# Patient Record
Sex: Female | Born: 1990 | Race: White | Hispanic: No | Marital: Married | Smoking: Current every day smoker
Health system: Southern US, Community
[De-identification: ages and names within clinical notes are randomized; demographics above are authoritative.]

## PROBLEM LIST (undated history)

## (undated) ENCOUNTER — Inpatient Hospital Stay (HOSPITAL_COMMUNITY): Payer: Self-pay

## (undated) DIAGNOSIS — J45909 Unspecified asthma, uncomplicated: Secondary | ICD-10-CM

## (undated) DIAGNOSIS — K219 Gastro-esophageal reflux disease without esophagitis: Secondary | ICD-10-CM

## (undated) DIAGNOSIS — F319 Bipolar disorder, unspecified: Secondary | ICD-10-CM

## (undated) DIAGNOSIS — K259 Gastric ulcer, unspecified as acute or chronic, without hemorrhage or perforation: Secondary | ICD-10-CM

## (undated) DIAGNOSIS — K5900 Constipation, unspecified: Secondary | ICD-10-CM

## (undated) HISTORY — DX: Bipolar disorder, unspecified: F31.9

## (undated) HISTORY — DX: Gastro-esophageal reflux disease without esophagitis: K21.9

## (undated) HISTORY — PX: DILATION AND CURETTAGE OF UTERUS: SHX78

## (undated) HISTORY — DX: Constipation, unspecified: K59.00

## (undated) HISTORY — DX: Gastric ulcer, unspecified as acute or chronic, without hemorrhage or perforation: K25.9

---
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
## 2011-07-08 LAB — RAPID URINE DRUG SCREEN, HOSP PERFORMED
Amphetamines: NOT DETECTED
Benzodiazepines: POSITIVE — AB
Cocaine: NOT DETECTED
Opiates: NOT DETECTED
Tetrahydrocannabinol: POSITIVE — AB

## 2011-07-08 LAB — DIFFERENTIAL
Basophils Absolute: 0
Basophils Relative: 0
Eosinophils Absolute: 0.4
Eosinophils Relative: 5
Lymphocytes Relative: 43
Lymphs Abs: 3.7
Monocytes Absolute: 0.5
Monocytes Relative: 6
Neutro Abs: 4
Neutrophils Relative %: 46

## 2011-07-08 LAB — CBC
HCT: 36
Hemoglobin: 12.7
MCHC: 35.3
MCV: 89.9
Platelets: 190
RBC: 4.01
RDW: 12.8
WBC: 8.6

## 2011-07-08 LAB — BASIC METABOLIC PANEL
BUN: 15
CO2: 22
Calcium: 8.7
Chloride: 108
Creatinine, Ser: 0.63
Glucose, Bld: 84
Potassium: 3.3 — ABNORMAL LOW
Sodium: 139

## 2011-07-08 LAB — ETHANOL

## 2011-07-08 LAB — POCT PREGNANCY, URINE: Operator id: 22250

## 2011-07-24 LAB — CBC
HCT: 31.4 — ABNORMAL LOW
HCT: 37.5
Hemoglobin: 11.1 — ABNORMAL LOW
Hemoglobin: 13.1
MCHC: 34.9
MCHC: 35.5
MCV: 93.7
MCV: 94.3
Platelets: 154 — ABNORMAL LOW
Platelets: 209
RBC: 3.35 — ABNORMAL LOW
RBC: 3.98
RDW: 12.5
RDW: 12.7
WBC: 10
WBC: 9

## 2011-07-24 LAB — RPR: RPR Ser Ql: NONREACTIVE

## 2011-07-25 LAB — URINALYSIS, ROUTINE W REFLEX MICROSCOPIC
Bilirubin Urine: NEGATIVE
Bilirubin Urine: NEGATIVE
Glucose, UA: NEGATIVE
Glucose, UA: NEGATIVE
Hgb urine dipstick: NEGATIVE
Ketones, ur: NEGATIVE
Ketones, ur: NEGATIVE
Nitrite: NEGATIVE
Nitrite: NEGATIVE
Protein, ur: NEGATIVE
Protein, ur: NEGATIVE
Specific Gravity, Urine: 1.015
Specific Gravity, Urine: 1.025
Urobilinogen, UA: 0.2
Urobilinogen, UA: 0.2
pH: 6.5
pH: 7.5

## 2011-07-25 LAB — URINE CULTURE
Colony Count: 15000
Colony Count: 40000

## 2011-07-25 LAB — CBC
HCT: 32.8 — ABNORMAL LOW
Hemoglobin: 11.5 — ABNORMAL LOW
MCHC: 35.2
MCV: 93.7
Platelets: 191
RBC: 3.5 — ABNORMAL LOW
RDW: 13
WBC: 10.6 — ABNORMAL HIGH

## 2011-07-25 LAB — URINE MICROSCOPIC-ADD ON

## 2011-07-25 LAB — KLEIHAUER-BETKE STAIN
Fetal Cells %: 0
Quantitation Fetal Hemoglobin: 0

## 2011-07-28 LAB — URINALYSIS, ROUTINE W REFLEX MICROSCOPIC
Bilirubin Urine: NEGATIVE
Glucose, UA: NEGATIVE
Hgb urine dipstick: NEGATIVE
Ketones, ur: NEGATIVE
Nitrite: NEGATIVE
Protein, ur: NEGATIVE
Specific Gravity, Urine: 1.009
Urobilinogen, UA: 0.2
pH: 6.5

## 2011-07-28 LAB — I-STAT 8, (EC8 V) (CONVERTED LAB)
Acid-base deficit: 3 — ABNORMAL HIGH
BUN: 4 — ABNORMAL LOW
Bicarbonate: 20.7
Chloride: 106
Glucose, Bld: 84
HCT: 35 — ABNORMAL LOW
Hemoglobin: 11.9 — ABNORMAL LOW
Operator id: 279831
Potassium: 3.6
Sodium: 138
TCO2: 22
pCO2, Ven: 32.5 — ABNORMAL LOW
pH, Ven: 7.412 — ABNORMAL HIGH

## 2011-07-28 LAB — POCT I-STAT CREATININE
Creatinine, Ser: 0.5
Operator id: 279831

## 2011-10-14 HISTORY — PX: ESOPHAGOGASTRODUODENOSCOPY: SHX1529

## 2015-06-24 ENCOUNTER — Encounter (HOSPITAL_COMMUNITY): Payer: Self-pay | Admitting: *Deleted

## 2015-06-24 ENCOUNTER — Inpatient Hospital Stay (HOSPITAL_COMMUNITY)
Admission: AD | Admit: 2015-06-24 | Discharge: 2015-06-24 | Disposition: A | Discharge status: Home / Self Care | Payer: Medicaid Other | Source: Ambulatory Visit | Attending: Obstetrics & Gynecology | Admitting: Obstetrics & Gynecology

## 2015-06-24 DIAGNOSIS — R0789 Other chest pain: Secondary | ICD-10-CM | POA: Insufficient documentation

## 2015-06-24 DIAGNOSIS — O26893 Other specified pregnancy related conditions, third trimester: Secondary | ICD-10-CM | POA: Diagnosis not present

## 2015-06-24 DIAGNOSIS — Z3A36 36 weeks gestation of pregnancy: Secondary | ICD-10-CM | POA: Insufficient documentation

## 2015-06-24 DIAGNOSIS — M62830 Muscle spasm of back: Secondary | ICD-10-CM

## 2015-06-24 DIAGNOSIS — Z87891 Personal history of nicotine dependence: Secondary | ICD-10-CM | POA: Insufficient documentation

## 2015-06-24 DIAGNOSIS — O9989 Other specified diseases and conditions complicating pregnancy, childbirth and the puerperium: Secondary | ICD-10-CM

## 2015-06-24 HISTORY — DX: Gastric ulcer, unspecified as acute or chronic, without hemorrhage or perforation: K25.9

## 2015-06-24 LAB — URINE MICROSCOPIC-ADD ON

## 2015-06-24 LAB — URINALYSIS, ROUTINE W REFLEX MICROSCOPIC
Bilirubin Urine: NEGATIVE
Glucose, UA: NEGATIVE mg/dL
Ketones, ur: NEGATIVE mg/dL
Nitrite: NEGATIVE
Protein, ur: NEGATIVE mg/dL
Specific Gravity, Urine: 1.015 (ref 1.005–1.030)
Urobilinogen, UA: 1 mg/dL (ref 0.0–1.0)
pH: 7 (ref 5.0–8.0)

## 2015-06-24 MED ORDER — CYCLOBENZAPRINE HCL 5 MG PO TABS: 5.0000 mg | ORAL_TABLET | Freq: Three times a day (TID) | ORAL | Status: DC | PRN

## 2015-06-24 MED ORDER — OXYCODONE-ACETAMINOPHEN 5-325 MG PO TABS
1.0000 | ORAL_TABLET | Freq: Once | ORAL | Status: AC
  Administered 2015-06-24: 1 via ORAL
  Filled 2015-06-24: qty 1

## 2015-06-24 NOTE — Discharge Instructions (Signed)

## 2015-06-24 NOTE — MAU Provider Note (Signed)
History     CSN: [id number] Arrival date and time: 06/24/15 7829  First Provider Initiated Contact with Patient 06/24/15 0208      Chief Complaint  Patient presents with  . Chest Pain   HPI Patient is 24 y.o. [id number] [redacted]w[redacted]d here with complaints of right chest wall pain Started at 945/10PM while lying down. Tried massaging. Feels a ball under here skin.  Never ahd before.  Reports she has been packing. Pain worse with strain- ie with sneezing, changing positions. Pain is non-radiating.  Did not try any medications for pain.   Denies dysuria, polyuria. Denies history of kidney infection. Denies SOB, trouble breathing.  No swelling or pain in calf.   +FM, denies LOF, VB, contractions, vaginal discharge.  Takes Zoloft and PNV Prenatal care- [facility] in [place], plans to [facility] at [facility].   OB History    Gravida Para Term Preterm AB TAB SAB Ectopic Multiple Living   Past Medical History  Diagnosis Date  . Stomach ulcer     Past Surgical History  Procedure Laterality Date  . Dilation and curettage of uterus      No family history on file.  Social History  Substance Use Topics  . Smoking status: Former Smoker    Quit date: 02/11/2015  . Smokeless tobacco: None  . Alcohol Use: No    Allergies: Allergies not on file  No prescriptions prior to admission    Review of Systems  Constitutional: Negative for fever and chills.  Eyes: Negative for blurred vision and double vision.  Respiratory: Negative for cough and shortness of breath.   Cardiovascular: Negative for chest pain and orthopnea.  Gastrointestinal: Negative for nausea and vomiting.  Genitourinary: Negative for dysuria, frequency and flank pain.  Musculoskeletal: Negative for myalgias and back pain.  Skin: Negative for rash.  Neurological: Negative for dizziness, tingling, weakness and headaches.  Endo/Heme/Allergies: Does not bruise/bleed easily.  Psychiatric/Behavioral:  Negative for depression and suicidal ideas. The patient is not nervous/anxious.    Physical Exam   Blood pressure 106/64, pulse 126, temperature 98.2 F (36.8 C), resp. rate 20, height  (1.626 m), weight 179 lb 6.4 oz (81.375 kg), SpO2 100 %.  Physical Exam  Nursing note and vitals reviewed. Constitutional: She is oriented to person, place, and time. She appears well-developed and well-nourished. No distress.  Pregnant female  HENT:  Head: Normocephalic and atraumatic.  Eyes: Conjunctivae are normal. No scleral icterus.  Neck: Normal range of motion. Neck supple.  Cardiovascular: Normal rate and intact distal pulses.   TTP over right inferior ribs  Respiratory: Effort normal. She exhibits no tenderness.  GI: Soft. There is no tenderness. There is no rebound and no guarding.  Gravid  Genitourinary: Vagina normal.  Musculoskeletal: Normal range of motion. She exhibits no edema.  Neurological: She is alert and oriented to person, place, and time.  Skin: Skin is warm and dry. No rash noted.  Psychiatric: She has a normal mood and affect.    MAU Course  Procedures  MDM UA- negative NST 125/mod/+accels/no decels Toco quiet  Assessment and Plan  Dessie Tatem is a 24 y.o. [id number] at [redacted]w[redacted]d presenting with Right chest wall pain in the setting of physical strain.   #Right side pain: most likely MSK pain. ddx include urinary course like pyelonephritis but UA is clean. Unlikely related to clot given non-pleuritic nature. No PTL as the  pt has not ctx on toco.  - Will give percocet #1 here in MAU - Home with scheduled tylenol and flexeril prn.  - Recommended stretches and heat to area.    Isa Rankin [facility] 06/24/2015, 2:08 AM

## 2015-06-24 NOTE — MAU Note (Addendum)
Pain in R rib area for couple hours. Worse with movement or when sneezes. Denies LOF or bleeding. WHen I stand up my stomach gets tight and feel pressure where his head is at. Pt gets PNC at [facility] in [place]. Office is in [place]

## 2016-04-28 ENCOUNTER — Encounter (HOSPITAL_COMMUNITY): Payer: Self-pay | Admitting: *Deleted

## 2018-03-15 ENCOUNTER — Emergency Department (HOSPITAL_COMMUNITY)
Admission: EM | Admit: 2018-03-15 | Discharge: 2018-03-15 | Disposition: A | Discharge status: Home / Self Care | Payer: Medicaid Other | Attending: Emergency Medicine | Admitting: Emergency Medicine

## 2018-03-15 ENCOUNTER — Encounter (HOSPITAL_COMMUNITY): Payer: Self-pay

## 2018-03-15 DIAGNOSIS — R569 Unspecified convulsions: Secondary | ICD-10-CM | POA: Insufficient documentation

## 2018-03-15 DIAGNOSIS — R109 Unspecified abdominal pain: Secondary | ICD-10-CM | POA: Diagnosis not present

## 2018-03-15 DIAGNOSIS — Z5321 Procedure and treatment not carried out due to patient leaving prior to being seen by health care provider: Secondary | ICD-10-CM | POA: Insufficient documentation

## 2018-03-15 LAB — URINALYSIS, ROUTINE W REFLEX MICROSCOPIC
Bacteria, UA: NONE SEEN
Bilirubin Urine: NEGATIVE
Glucose, UA: NEGATIVE mg/dL
Ketones, ur: NEGATIVE mg/dL
Leukocytes, UA: NEGATIVE
Nitrite: NEGATIVE
Protein, ur: NEGATIVE mg/dL
Specific Gravity, Urine: 1.024 (ref 1.005–1.030)
pH: 5 (ref 5.0–8.0)

## 2018-03-15 LAB — COMPREHENSIVE METABOLIC PANEL
ALT: 12 U/L — ABNORMAL LOW (ref 14–54)
AST: 17 U/L (ref 15–41)
Albumin: 4.7 g/dL (ref 3.5–5.0)
Alkaline Phosphatase: 50 U/L (ref 38–126)
Anion gap: 10 (ref 5–15)
BUN: 13 mg/dL (ref 6–20)
CO2: 23 mmol/L (ref 22–32)
Calcium: 9.2 mg/dL (ref 8.9–10.3)
Chloride: 108 mmol/L (ref 101–111)
Creatinine, Ser: 0.7 mg/dL (ref 0.44–1.00)
GFR calc Af Amer: >60 mL/min (ref >60)
GFR calc non Af Amer: >60 mL/min (ref >60)
Glucose, Bld: 112 mg/dL — ABNORMAL HIGH (ref 65–99)
Potassium: 4.3 mmol/L (ref 3.5–5.1)
Sodium: 141 mmol/L (ref 135–145)
Total Bilirubin: 0.4 mg/dL (ref 0.3–1.2)
Total Protein: 7.7 g/dL (ref 6.5–8.1)

## 2018-03-15 LAB — CBC
HCT: 48.2 % — ABNORMAL HIGH (ref 36.0–46.0)
Hemoglobin: 15.7 g/dL — ABNORMAL HIGH (ref 12.0–15.0)
MCH: 30.5 pg (ref 26.0–34.0)
MCHC: 32.6 g/dL (ref 30.0–36.0)
MCV: 93.6 fL (ref 78.0–100.0)
Platelets: 254 10*3/uL (ref 150–400)
RBC: 5.15 MIL/uL — ABNORMAL HIGH (ref 3.87–5.11)
RDW: 13.6 % (ref 11.5–15.5)
WBC: 11.9 10*3/uL — ABNORMAL HIGH (ref 4.0–10.5)

## 2018-03-15 LAB — I-STAT BETA HCG BLOOD, ED (MC, WL, AP ONLY): I-stat hCG, quantitative: <5 m[IU]/mL (ref <5)

## 2018-03-15 LAB — LIPASE, BLOOD: Lipase: 27 U/L (ref 11–51)

## 2018-03-15 MED ORDER — OXYCODONE-ACETAMINOPHEN 5-325 MG PO TABS
1.0000 | ORAL_TABLET | ORAL | Status: DC | PRN
  Administered 2018-03-15: 1 via ORAL
  Filled 2018-03-15: qty 1

## 2018-03-15 NOTE — ED Notes (Signed)
Pt stating she was leaving due to wait. Pt was encouraged to stay and be seen. Pt was seen leaving out the lobby.

## 2018-03-15 NOTE — ED Notes (Signed)
Visitor brought patient to desk in wheelchair stating patient just had a 15 second seizure in bathroom. Visitor stated he saw her whole body shaking and brought patient to desk immediately after seizure. No fall, patient stayed in wheelchair. Patient alert and oriented, no change in mental status, patient not post ictal. Patient angry stating "so if my intestines rupture you're just going to leave me out here" and left the desk. Patient reassured that her lab work results will be reviewed and vital signs reassessed as reasonable. Patient and family member expressed dissatisfaction about wait time.

## 2018-03-15 NOTE — ED Notes (Signed)
Pt and significant other came to desk stating that the pt "just had seizure in the bathroom for 15 seconds and something needs to be done now". RN notified

## 2018-03-15 NOTE — ED Triage Notes (Signed)
Pt presents for evaluation of lower abd pain with cramping intermittently x 2 days. Pt reports LMP 2 weeks ago but has had spotting since then. Pt denies pregnancy but states pain feels like labor. No N/V/D.

## 2018-05-27 ENCOUNTER — Other Ambulatory Visit: Payer: Self-pay

## 2018-05-27 ENCOUNTER — Emergency Department (HOSPITAL_COMMUNITY)
Admission: EM | Admit: 2018-05-27 | Discharge: 2018-05-27 | Discharge status: Against Medical Advice | Payer: Medicaid Other | Attending: Emergency Medicine | Admitting: Emergency Medicine

## 2018-05-27 ENCOUNTER — Emergency Department (HOSPITAL_COMMUNITY): Payer: Medicaid Other

## 2018-05-27 ENCOUNTER — Encounter (HOSPITAL_COMMUNITY): Payer: Self-pay

## 2018-05-27 ENCOUNTER — Ambulatory Visit (HOSPITAL_COMMUNITY): Payer: Medicaid Other

## 2018-05-27 DIAGNOSIS — R110 Nausea: Secondary | ICD-10-CM | POA: Diagnosis not present

## 2018-05-27 DIAGNOSIS — R102 Pelvic and perineal pain: Secondary | ICD-10-CM | POA: Diagnosis not present

## 2018-05-27 LAB — CBC
HCT: 40.2 % (ref 36.0–46.0)
Hemoglobin: 13 g/dL (ref 12.0–15.0)
MCH: 31 pg (ref 26.0–34.0)
MCHC: 32.3 g/dL (ref 30.0–36.0)
MCV: 95.9 fL (ref 78.0–100.0)
Platelets: 183 10*3/uL (ref 150–400)
RBC: 4.19 MIL/uL (ref 3.87–5.11)
RDW: 12.7 % (ref 11.5–15.5)
WBC: 5.3 10*3/uL (ref 4.0–10.5)

## 2018-05-27 LAB — COMPREHENSIVE METABOLIC PANEL
ALT: 10 U/L (ref 0–44)
AST: 13 U/L — ABNORMAL LOW (ref 15–41)
Albumin: 4.2 g/dL (ref 3.5–5.0)
Alkaline Phosphatase: 47 U/L (ref 38–126)
Anion gap: 7 (ref 5–15)
BUN: 11 mg/dL (ref 6–20)
CO2: 23 mmol/L (ref 22–32)
Calcium: 8.8 mg/dL — ABNORMAL LOW (ref 8.9–10.3)
Chloride: 110 mmol/L (ref 98–111)
Creatinine, Ser: 0.74 mg/dL (ref 0.44–1.00)
GFR calc Af Amer: >60 mL/min (ref >60)
GFR calc non Af Amer: >60 mL/min (ref >60)
Glucose, Bld: 88 mg/dL (ref 70–99)
Potassium: 3.6 mmol/L (ref 3.5–5.1)
Sodium: 140 mmol/L (ref 135–145)
Total Bilirubin: 0.7 mg/dL (ref 0.3–1.2)
Total Protein: 6.7 g/dL (ref 6.5–8.1)

## 2018-05-27 LAB — LIPASE, BLOOD: Lipase: 25 U/L (ref 11–51)

## 2018-05-27 LAB — I-STAT BETA HCG BLOOD, ED (MC, WL, AP ONLY): I-stat hCG, quantitative: <5 m[IU]/mL (ref <5)

## 2018-05-27 NOTE — ED Triage Notes (Signed)
Pt reports lower abdominal pain in the pelvic region for several days. She states she had BV 3 weeks ago. She states she was also diagnosed with chlamydia and given abx which she completed. She reports pain with intercourse as well as nausea.

## 2018-05-27 NOTE — ED Provider Notes (Signed)
Patient placed in Quick Look pathway, seen and evaluated   Chief Complaint: abdominal pain  HPI:   Samantha Russell is a 27 y.o. [id number], LMP 03/27/18 who presents to the ED with lower abdominal pain that started about a month ago with just discomfort but has gotten worse. Patient reports white milky d/c. She is sexually active and c/o pain when having sex. One sex partner x 6 months, unprotected sex. Treated for Chlamydia 3 weeks ago and partner was treated at the same time. Last Pap smear over one year ago and was normal. No UTI symptoms  ROS: GI: morning nausea, abdominal pain  GU: dyspareunia   Physical Exam:  BP 122/85 (BP Location: Right Arm)   Pulse 64   Temp 98.3 F (36.8 C) (Oral)   Resp 18   SpO2 100%    Gen: No distress no fever or chills  Neuro: Awake and Alert  Skin: Warm and dry  Abdomen: soft, tender with palpation lower abdomen, no guarding or rebound.    Initiation of care has begun. The patient has been counseled on the process, plan, and necessity for staying for the completion/evaluation, and the remainder of the medical screening examination    Janne Napoleoneese, Hope M, NP 05/27/18 1515    Loren RacerYelverton, David, MD 05/28/18 [phone]

## 2018-06-07 ENCOUNTER — Emergency Department (HOSPITAL_COMMUNITY)
Admission: EM | Admit: 2018-06-07 | Discharge: 2018-06-07 | Disposition: A | Discharge status: Home / Self Care | Payer: Medicaid Other | Attending: Emergency Medicine | Admitting: Emergency Medicine

## 2018-06-07 DIAGNOSIS — M542 Cervicalgia: Secondary | ICD-10-CM | POA: Diagnosis not present

## 2018-06-07 DIAGNOSIS — Z5321 Procedure and treatment not carried out due to patient leaving prior to being seen by health care provider: Secondary | ICD-10-CM | POA: Diagnosis not present

## 2018-06-07 NOTE — ED Notes (Signed)
I called patient in the lobby  For triage and no one responded

## 2018-06-07 NOTE — ED Notes (Signed)
I called patient name in the lobby to be triage and no one responded 

## 2018-11-01 ENCOUNTER — Ambulatory Visit: Payer: Medicaid Other | Admitting: Allergy and Immunology

## 2018-11-15 ENCOUNTER — Ambulatory Visit: Payer: Medicaid Other | Admitting: Allergy & Immunology

## 2018-11-18 ENCOUNTER — Other Ambulatory Visit: Payer: Self-pay | Admitting: Neurology

## 2018-11-18 ENCOUNTER — Encounter: Payer: Self-pay | Admitting: Neurology

## 2018-11-22 ENCOUNTER — Ambulatory Visit: Payer: Medicaid Other | Admitting: Neurology

## 2018-11-22 ENCOUNTER — Encounter: Payer: Self-pay | Admitting: Neurology

## 2018-11-22 VITALS — BP 122/84 | HR 88 | Ht 64.0 in | Wt 161.0 lb

## 2018-11-22 DIAGNOSIS — R4789 Other speech disturbances: Secondary | ICD-10-CM

## 2018-11-22 NOTE — Progress Notes (Signed)
NEUROLOGY CLINIC   Provider:  Melvyn Novas, MD   Primary Care Physician:  Kaleen Mask, MD   Referring Provider: Jeannetta Nap, MD  Chief Complaint  Patient presents with  . New Patient (Initial Visit)    pt alone, rm 11. pt states that she has had episodes about 3 mths after she gave birth to youngest. PCP wanted to rule out seizures. her mom had "sz'" when she has stress, not in treatment. Marland Kitchen last episode was  2 mths ago    HPI:  Samantha Russell is a 28 y.o. female patient and seen here on 11-22-2018 in a referral from Dr.Elkins for evaluation of possible seizures.   The patient reports onset of spells involving her back, back , that let to her "head being pulled back"-  Mar 11, 2017 she received an epidural while giving birth to her last and fourth child , and "everything started with that ". She has reportedly a lot of back pain. She stayed in a shelter for [facility] in January 2019 and there had headaches, pains, and stress induced tensions.  She had a seizure in the bath at the shelter, and reportedly was alone , but described her nuchal area was in spasms, her eyes rolled back in her head .. ( she had no witness to this activity),  She found help with a chiropractor, who reportedly also compared her spells to seizures.She has 4 children , age 32, 16, 61 and 48 years old.  She has been back and fourth with the partner, father of her children, never married.  Carries the  diagnosis of bipolar depression since age 40 ,  PTSD and depression at age 69 , but she discontinued medications. She has had repeated black outs, impulse control failures. She started back on Lamictal just last month, January 2020. She was on ativan- and has no refills for lamictal...        Chief complaint according to patient : " an EMS tech and her Chiropractor have mentioned she has seizures".   Sleep habits are as follows: 5.30 is the beginning of her day, children leave for school at 7.30 AM. 2  youngest stay with their uncle. She works as a Child psychotherapist, many places. She works until 4 PM and is home by 4.30, dinner time for the children is 6 PM.  Bedtime average is 9.30 PM- she can easily go to sleep, wakes up frequently in the last 3 weeks, usually sleeps through. Nocturia 1-2 times. No vivid dreams.  Continues to yawn.   Medical history:  Lived in [facility] and was told there that she has Hep c, but repeat testing was negative. respiratory allergies, bipolar. Repeated chlamydia infection. Stomach ulcers. 4 pregnancies, one miscarriage in 2011 .  No TBI, no neck trauma,  anxiety attacks.   Family sleep history: mother with non epileptic seizures. Father died at age 21, was born in 60, died of heart disease.  14 siblings -  10 half-siblings on the paternal side. , 3 full sisters with mother .     Social history: single mother of 21,  Brother ( 21)  stays with her since she left the shelter March 12th 2019.  She lives alone with her 4 children. Not driving.  Did not quit smoking, she smokes, menthols.   Alcohol: " seldomly" , brings the spells on. She is becoming tense, and  Caffeine :  2 -3 sodas daily - some sweet tea.  Review of Systems: Out of  a complete 14 system review, the patient complains of only the following symptoms, and all other reviewed systems are negative.   Pain between the shoulder blades.   Spells when she drinks alcohol. Spells start between the shoulder blades, rise up and hurt - stabbing sharp pains in the nape of the neck.  " like ripping my spine out of head" . Lasting 5-12 minutes in duration, one time she had a spell of 35 minutes duration, her BP dropped and her heart rate became slow- NOT TYPICAL FOR SEIZURES> .    EDS/ Fatigue severity score 40/ 63 points    , depression score N/a  Social History   Socioeconomic History  . Marital status: Legally Separated    Spouse name: Not on file  . Number of children: Not on file  . Years of education: Not on file    . Highest education level: Not on file  Occupational History  . Not on file  Social Needs  . Financial resource strain: Not on file  . Food insecurity:    Worry: Not on file    Inability: Not on file  . Transportation needs:    Medical: Not on file    Non-medical: Not on file  Tobacco Use  . Smoking status: Former Smoker    Last attempt to quit: 02/11/2015    Years since quitting: 3.7  . Smokeless tobacco: Never Used  Substance and Sexual Activity  . Alcohol use: Yes    Comment: rare  . Drug use: No  . Sexual activity: Yes    Birth control/protection: None  Lifestyle  . Physical activity:    Days per week: Not on file    Minutes per session: Not on file  . Stress: Not on file  Relationships  . Social connections:    Talks on phone: Not on file    Gets together: Not on file    Attends religious service: Not on file    Active member of club or organization: Not on file    Attends meetings of clubs or organizations: Not on file    Relationship status: Not on file  . Intimate partner violence:    Fear of current or ex partner: Not on file    Emotionally abused: Not on file    Physically abused: Not on file    Forced sexual activity: Not on file  Other Topics Concern  . Not on file  Social History Narrative  . Not on file    No family history on file.  Past Medical History:  Diagnosis Date  . Bipolar 1 disorder (HCC)   . Stomach ulcer     Past Surgical History:  Procedure Laterality Date  . DILATION AND CURETTAGE OF UTERUS      Current Outpatient Medications  Medication Sig Dispense Refill  . lamoTRIgine (LAMICTAL) 25 MG tablet TAKE 1 TABLET BY MOUTH AT BEDTIME X 2 WEEKS THE 2 TABLETS AT BEDTIME    . pantoprazole (PROTONIX) 40 MG tablet TAKE 1 TABLET BY MOUTH EVERY DAY FOR REFLUX    . sertraline (ZOLOFT) 100 MG tablet Take by mouth.     No current facility-administered medications for this visit.     Allergies as of 11/22/2018 - Review Complete  11/22/2018  Allergen Reaction Noted  . Penicillins Other (See Comments) 06/24/2015    Vitals: BP 122/84   Pulse 88   Ht 5\' 4"  (1.626 m)   Wt 161 lb (73 kg)   BMI 27.64 kg/m  Last Weight:  Wt Readings from Last 1 Encounters:  11/22/18 161 lb (73 kg)   ZOX:WRUEBMI:Body mass index is 27.64 kg/m.     Last Height:   Ht Readings from Last 1 Encounters:  11/22/18 5\' 4"  (1.626 m)    Physical exam:  General: The patient is awake, alert and appears not in acute distress. The patient is well groomed. Head: Normocephalic, atraumatic. Neck is supple. Mallampati 1- reddened mucosa, very irritated skin. Patient is coughing .   neck circumference:13". Nasal airflow congested ,  Cardiovascular:  Regular rate and rhythm , without  murmurs or carotid bruit, and without distended neck veins. Respiratory: COUGHING , not wheezing auscultation. Skin:  Tattooed .Trunk: BMI is normal . The patient's posture is erect.   Neurologic exam : The patient is awake and alert, oriented to place and time.    Cranial nerves: Pupils are equal and briskly reactive to light. Funduscopic exam deferred. . Extraocular movements  in vertical and horizontal planes intact and without nystagmus. Visual fields by finger perimetry are intact. Hearing to finger rub intact.   Facial sensation intact to fine touch.  Facial motor strength is symmetric and tongue and uvula move midline. Shoulder shrug was symmetrical.   Motor exam:  Normal tone, muscle bulk and symmetric strength in all extremities. Sensory:  Fine touch, pinprick and vibration were tested in all extremities. Proprioception tested in the upper extremities was normal. Coordination: Rapid alternating movements in the fingers/hands was normal. Finger-to-nose maneuver  normal without evidence of ataxia, dysmetria or tremor.  Gait and station: Patient walks without assistive device. Deep tendon reflexes:  Symmetric.    Assessment:  After physical and neurologic  examination, review of laboratory studies,  Personal review of imaging studies, reports of other /same  Imaging studies, results of polysomnography and / or neurophysiology testing and pre-existing records as far as provided in visit., my assessment is:    1) Strong psychiatric history with a  father with antisocial personalily disorder, bipolar. Paternal grandparents were incestuous, first cousins. Mother has pseudoseizures, PTSD>  Patient has anxiety and impulse control  loss, " black outs'   2) no evidence of incontinenece, tongue bites or convulsions. No previous EEGs. I will order EEG with photic stimulation and with hyperventilation. She has to be free of her cough at the time.   3) she has stopped Lamictal and became more irritable, impulsive , almost violent.  " I am mad and I am sad "  She has not seen psychiatrist since age 89 or [age] (!) . Her history is very common for non epileptic spells.    The patient was advised of the nature of the diagnosed disorder , the treatment options and the  risks for general health and wellness arising from not treating the condition.   I spent more than  minutes of face to face time with the patient.  Greater than 50% of time was spent in counseling and coordination of care. We have discussed the diagnosis and differential and I answered the patient's questions.    Plan:  Treatment plan and additional workup :  Will evaluate by EEG, strongly recommend Psychiatric referral (before neurology referral ).    Melvyn NovasARMEN Alaiya Martindelcampo, MD 11/22/2018, 1:23 PM  Certified in Neurology by ABPN Certified in Sleep Medicine by [facility]  [facility] [address], [address] [place], [place] [id number]

## 2018-12-27 ENCOUNTER — Other Ambulatory Visit: Payer: Medicaid Other

## 2019-01-13 ENCOUNTER — Other Ambulatory Visit: Payer: Medicaid Other

## 2019-01-19 ENCOUNTER — Ambulatory Visit: Payer: Medicaid Other | Admitting: Allergy

## 2019-02-17 ENCOUNTER — Ambulatory Visit (INDEPENDENT_AMBULATORY_CARE_PROVIDER_SITE_OTHER): Payer: Medicaid Other | Admitting: Allergy

## 2019-02-17 ENCOUNTER — Ambulatory Visit: Payer: Medicaid Other | Admitting: Allergy

## 2019-02-17 ENCOUNTER — Other Ambulatory Visit: Payer: Self-pay

## 2019-02-17 ENCOUNTER — Encounter: Payer: Self-pay | Admitting: Allergy

## 2019-02-17 VITALS — BP 108/64 | HR 72 | Temp 98.2°F | Resp 18 | Ht 63.0 in | Wt 176.0 lb

## 2019-02-17 DIAGNOSIS — J3089 Other allergic rhinitis: Secondary | ICD-10-CM | POA: Insufficient documentation

## 2019-02-17 DIAGNOSIS — R21 Rash and other nonspecific skin eruption: Secondary | ICD-10-CM

## 2019-02-17 DIAGNOSIS — Z880 Allergy status to penicillin: Secondary | ICD-10-CM | POA: Diagnosis not present

## 2019-02-17 DIAGNOSIS — J4530 Mild persistent asthma, uncomplicated: Secondary | ICD-10-CM | POA: Diagnosis not present

## 2019-02-17 DIAGNOSIS — Z720 Tobacco use: Secondary | ICD-10-CM

## 2019-02-17 MED ORDER — ALBUTEROL SULFATE HFA 108 (90 BASE) MCG/ACT IN AERS: 2.0000 | INHALATION_SPRAY | Freq: Four times a day (QID) | RESPIRATORY_TRACT | 1 refills | Status: AC | PRN

## 2019-02-17 MED ORDER — FLUTICASONE PROPIONATE HFA 110 MCG/ACT IN AERO: 2.0000 | INHALATION_SPRAY | Freq: Two times a day (BID) | RESPIRATORY_TRACT | 5 refills | Status: DC

## 2019-02-17 MED ORDER — FLUTICASONE PROPIONATE 50 MCG/ACT NA SUSP: NASAL | 5 refills | Status: DC

## 2019-02-17 NOTE — Assessment & Plan Note (Addendum)
History of penicillin allergy as a child. Unsure of specific reaction but may have had hives with SOB. There is a note in her previous provider's chart that she took amoxicillin a few years ago with no issues but patient not sure.   Schedule for penicillin testing and challenge in the future.   Continue to avoid penicillin for now.

## 2019-02-17 NOTE — Assessment & Plan Note (Signed)
Denies any formal diagnosis of asthma but has been having issues with coughing at times. She apparently had albuterol prescribed on and off for the past 20+ years but currently does not have one. Smoking 1/2 ppd x 10 years.  Today's spirometry showed: normal pattern with 21% improvement in FEV1 post bronchodilator treatment concerning for asthma.  . Daily controller medication(s): stat a trial of Flovent 110 2 puffs twice a day with spacer and rinse mouth afterwards. . Prior to physical activity: May use albuterol rescue inhaler 2 puffs 5 to 15 minutes prior to strenuous physical activities. Marland Kitchen Rescue medications: May use albuterol rescue inhaler 2 puffs or nebulizer every 4 to 6 hours as needed for shortness of breath, chest tightness, coughing, and wheezing. Monitor frequency of use.

## 2019-02-17 NOTE — Assessment & Plan Note (Addendum)
Perennial rhinitis symptoms, eustachian tube dysfunction and pruritus for the past 10+ years with worsening in the wintertime.  Skin testing in 2017 was positive to weed, dust mites, cockroach and molds.  Patient was on allergy immunotherapy for a few months with some benefit and would like to restart at our office.   Unable to skin test today due to recent antihistamine intake. She takes hydroxyzine for anxiety. Offered bloodwork but patient would like to come back for skin prick testing.   Return for skin testing - must be off all antihistamines including hydroxyzine for at least 3-5 days.  Will make additional recommendations based on results.  Start Flonase 1-2 sprays daily.  Nasal saline spray (i.e., Simply Saline) or nasal saline lavage (i.e., NeilMed) is recommended as needed and prior to medicated nasal sprays.

## 2019-02-17 NOTE — Patient Instructions (Addendum)
We will get records from your previous allergist. Return for skin testing - must be off all antihistamines including hydroxyzine for at least 3-5 days. Will make additional recommendations based on results.  Start Flonase 1-2 sprays daily. This may help your ear fullness. Nasal saline spray (i.e., Simply Saline) or nasal saline lavage (i.e., NeilMed) is recommended as needed and prior to medicated nasal sprays.  Asthma: . Daily controller medication(s): stat Flovent 110 2 puffs twice a day with spacer and rinse mouth afterwards. . Prior to physical activity: May use albuterol rescue inhaler 2 puffs 5 to 15 minutes prior to strenuous physical activities. Marland Kitchen Rescue medications: May use albuterol rescue inhaler 2 puffs or nebulizer every 4 to 6 hours as needed for shortness of breath, chest tightness, coughing, and wheezing. Monitor frequency of use.  . Asthma control goals:  o Full participation in all desired activities (may need albuterol before activity) o Albuterol use two times or less a week on average (not counting use with activity) o Cough interfering with sleep two times or less a month o Oral steroids no more than once a year o No hospitalizations  Schedule for penicillin testing and challenge in the future. You have to be off antihistamines for 3-5 days for this as well. Continue to avoid penicillin for now.  Start proper skin care.  Follow up next week for skin testing in the [facility] office.    Skin care recommendations  Bath time: . Always use lukewarm water. AVOID very hot or cold water. Marland Kitchen Keep bathing time to 5-10 minutes. . Do NOT use bubble bath. . Use a mild soap and use just enough to wash the dirty areas. . Do NOT scrub skin vigorously.  . After bathing, pat dry your skin with a towel. Do NOT rub or scrub the skin.  Moisturizers and prescriptions:  . ALWAYS apply moisturizers immediately after bathing (within 3 minutes). This helps to lock-in moisture. . Use  the moisturizer several times a day over the whole body. Peri Jefferson summer moisturizers include: Aveeno, CeraVe, Cetaphil. Peri Jefferson winter moisturizers include: Aquaphor, Vaseline, Cerave, Cetaphil, Eucerin, Vanicream. . When using moisturizers along with medications, the moisturizer should be applied about one hour after applying the medication to prevent diluting effect of the medication or moisturize around where you applied the medications. When not using medications, the moisturizer can be continued twice daily as maintenance.  Laundry and clothing: . Avoid laundry products with added color or perfumes. . Use unscented hypo-allergenic laundry products such as Tide free, Cheer free & gentle, and All free and clear.  . If the skin still seems dry or sensitive, you can try double-rinsing the clothes. . Avoid tight or scratchy clothing such as wool. . Do not use fabric softeners or dyer sheets.

## 2019-02-17 NOTE — Assessment & Plan Note (Signed)
   Discussed smoking cessation. 

## 2019-02-17 NOTE — Progress Notes (Signed)
New Patient Note  RE: Samantha Russell MRN: [id number] DOB: 1991-07-28 Date of Office Visit: 02/17/2019  Referring provider: Kaleen Mask, * Primary care provider: Kaleen Mask, MD  Chief Complaint: Allergies (she was receiving allergy injections with [facility]. She has recently moved near our [place] office. She would like to do injections there. She is also interested in receiving penicillin testing. )  History of Present Illness: I had the pleasure of seeing Samantha Russell for initial evaluation at the [facility] on 02/17/2019. She is a 28 y.o. female, who is referred here by Kaleen Mask, MD for the evaluation of allergies.  [facility] by [facility] in [place].   Allergic rhinitis: She reports symptoms of PND, cough, eustachian tube dysfunction, nasal congestion, rhinorrhea, itchy skin, rash. Symptoms have been going on for 10+ years. The symptoms are present all year around with worsening in winter. Anosmia: no. Headache: yes but not caused by allergist. She has used allegra with some improvement in symptoms. Sinus infections: no. Previous work up includes: skin testing in 2017 was positive to weed, dust mites, cockroach, molds.  Patient was on allergy injection for a few months with some benefit. No reactions to the injections. Patient wants to restart allergy injections at our office.  Previous ENT evaluation: no.  Assessment and Plan: Samantha Russell is a 28 y.o. female with: Other allergic rhinitis Perennial rhinitis symptoms, eustachian tube dysfunction and pruritus for the past 10+ years with worsening in the wintertime.  Skin testing in 2017 was positive to weed, dust mites, cockroach and molds.  Patient was on allergy immunotherapy for a few months with some benefit and would like to restart at our office.   Unable to skin test today due to recent antihistamine intake. She takes hydroxyzine for anxiety.  Offered bloodwork but patient would like to come back for skin prick testing.   Return for skin testing - must be off all antihistamines including hydroxyzine for at least 3-5 days.  Will make additional recommendations based on results.  Start Flonase 1-2 sprays daily.  Nasal saline spray (i.e., Simply Saline) or nasal saline lavage (i.e., NeilMed) is recommended as needed and prior to medicated nasal sprays.  Mild persistent asthma without complication Denies any formal diagnosis of asthma but has been having issues with coughing at times. She apparently had albuterol prescribed on and off for the past 20+ years but currently does not have one. Smoking 1/2 ppd x 10 years.  Today's spirometry showed: normal pattern with 21% improvement in FEV1 post bronchodilator treatment concerning for asthma.  . Daily controller medication(s): stat  a trial of Flovent 110 2 puffs twice a day with spacer and rinse mouth afterwards. . Prior to physical activity: May use albuterol rescue inhaler 2 puffs 5 to 15 minutes prior to strenuous physical activities. Marland Kitchen Rescue medications: May use albuterol rescue inhaler 2 puffs or nebulizer every 4 to 6 hours as needed for shortness of breath, chest tightness, coughing, and wheezing. Monitor frequency of use.   Tobacco user  Discussed smoking cessation.   Penicillin allergy History of penicillin allergy as a child. Unsure of specific reaction but may have had hives with SOB. There is a note in her previous provider's chart that she took amoxicillin a few years ago with no issues but patient not sure.   Schedule for penicillin testing and challenge in the future.   Continue to avoid penicillin for now.  Rash and  other nonspecific skin eruption Rash improved with allergy immunotherapy in the past.  Discussed proper skin care measures.   Will make additional recommendations after skin testing next week.   Return in about 5 days (around 02/22/2019) for Skin  testing.  Meds ordered this encounter  Medications  . albuterol (VENTOLIN HFA) 108 (90 Base) MCG/ACT inhaler    Sig: Inhale 2 puffs into the lungs every 6 (six) hours as needed for wheezing or shortness of breath.    Dispense:  1 Inhaler    Refill:  1  . fluticasone (FLONASE) 50 MCG/ACT nasal spray    Sig: Take 1-2 sprays daily    Dispense:  16 g    Refill:  5  . fluticasone (FLOVENT HFA) 110 MCG/ACT inhaler    Sig: Inhale 2 puffs into the lungs 2 (two) times a day.    Dispense:  1 Inhaler    Refill:  5   Other allergy screening: Asthma: no  Patient had albuterol in the past on and off for 20+ years. No diagnosis of asthma.  Rhino conjunctivitis: yes Food allergy: no  Medication allergy: yes  Penicillin - not sure of exact reaction, ? Hives, ? SOB Hymenoptera allergy: no Urticaria: no Eczema:no History of recurrent infections suggestive of immunodeficency: no  Diagnostics: Spirometry:  Tracings reviewed. Her effort: Good reproducible efforts. FVC: 2.94L FEV1: 2.24L, 72% predicted FEV1/FVC ratio: 76% Interpretation: Spirometry consistent with normal pattern with 21% improvement in FEV1 post bronchodilator treatment.  Please see scanned spirometry results for details.  Skin Testing: Deferred due to recent antihistamines use.  Past Medical History: Patient Active Problem List   Diagnosis Date Noted  . Other allergic rhinitis 02/17/2019  . Penicillin allergy 02/17/2019  . Tobacco user 02/17/2019  . Mild persistent asthma without complication 02/17/2019  . Rash and other nonspecific skin eruption 02/17/2019   Past Medical History:  Diagnosis Date  . Bipolar 1 disorder (HCC)   . Stomach ulcer    Past Surgical History: Past Surgical History:  Procedure Laterality Date  . DILATION AND CURETTAGE OF UTERUS     Medication List:  Current Outpatient Medications  Medication Sig Dispense Refill  . diphenhydrAMINE (BENADRYL) 25 MG tablet Take 25 mg by mouth every 6  (six) hours as needed.    . hydrOXYzine (VISTARIL) 50 MG capsule TAKE 1 2 CAPSULES BY MOUTH EVERY 8 HOURS AS NEEDED FOR PANIC ATTACKS MAY CAUSE SEDATION    . lamoTRIgine (LAMICTAL) 25 MG tablet TAKE 1 TABLET BY MOUTH AT BEDTIME X 2 WEEKS THE 2 TABLETS AT BEDTIME    . pantoprazole (PROTONIX) 40 MG tablet TAKE 1 TABLET BY MOUTH EVERY DAY FOR REFLUX    . sertraline (ZOLOFT) 100 MG tablet Take by mouth.    Marland Kitchen albuterol (VENTOLIN HFA) 108 (90 Base) MCG/ACT inhaler Inhale 2 puffs into the lungs every 6 (six) hours as needed for wheezing or shortness of breath. 1 Inhaler 1  . fluticasone (FLONASE) 50 MCG/ACT nasal spray Take 1-2 sprays daily 16 g 5  . fluticasone (FLOVENT HFA) 110 MCG/ACT inhaler Inhale 2 puffs into the lungs 2 (two) times a day. 1 Inhaler 5   No current facility-administered medications for this visit.    Allergies: Allergies  Allergen Reactions  . Penicillins Other (See Comments)    Pt is unsure but has been told she is allergic   Social History: Social History   Socioeconomic History  . Marital status: Legally Separated    Spouse name: Not on file  .  Number of children: Not on file  . Years of education: Not on file  . Highest education level: Not on file  Occupational History  . Not on file  Social Needs  . Financial resource strain: Not on file  . Food insecurity:    Worry: Not on file    Inability: Not on file  . Transportation needs:    Medical: Not on file    Non-medical: Not on file  Tobacco Use  . Smoking status: Current Every Day Smoker    Packs/day: 0.50    Years: 10.00    Pack years: 5.00    Types: Cigarettes  . Smokeless tobacco: Never Used  Substance and Sexual Activity  . Alcohol use: Yes    Comment: rare  . Drug use: No  . Sexual activity: Yes    Birth control/protection: None  Lifestyle  . Physical activity:    Days per week: Not on file    Minutes per session: Not on file  . Stress: Not on file  Relationships  . Social connections:     Talks on phone: Not on file    Gets together: Not on file    Attends religious service: Not on file    Active member of club or organization: Not on file    Attends meetings of clubs or organizations: Not on file    Relationship status: Not on file  Other Topics Concern  . Not on file  Social History Narrative  . Not on file   Lives in a house built in [phone].. Smoking: 1/2 pack per day x 8648yr Occupation: house keeping  Environmental History: Water Damage/mildew in the house: no Carpet in the family room: no Carpet in the bedroom: no Heating: electric and wood Cooling: central Pet: no  Family History: Family History  Problem Relation Age of Onset  . Anxiety disorder Mother   . Depression Mother   . Heart disease Father   . Diabetes Maternal Grandmother   . Diabetes Paternal Grandmother    Problem                               Relation Asthma                                   Child  Eczema                                No  Food allergy                          No  Allergic rhino conjunctivitis     Child   Review of Systems  Constitutional: Negative for appetite change, chills, fever and unexpected weight change.  HENT: Positive for congestion and postnasal drip. Negative for rhinorrhea.   Eyes: Negative for itching.  Respiratory: Positive for cough and wheezing. Negative for chest tightness and shortness of breath.   Cardiovascular: Negative for chest pain.  Gastrointestinal: Negative for abdominal pain.  Genitourinary: Negative for difficulty urinating.  Skin: Positive for rash.  Allergic/Immunologic: Positive for environmental allergies. Negative for food allergies.  Neurological: Negative for headaches.   Objective: BP 108/64 (BP Location: Right Arm, Patient Position: Sitting, Cuff Size: Normal)   Pulse 72   Temp 98.2 F (  36.8 C) (Oral)   Resp 18   Ht  (1.6 m)   Wt 176 lb (79.8 kg)   SpO2 95%   BMI 31.18 kg/m  Body mass index is 31.18 kg/m.  Physical Exam  Constitutional: She is oriented to person, place, and time. She appears well-developed and well-nourished.  HENT:  Head: Normocephalic and atraumatic.  Right Ear: External ear normal.  Left Ear: External ear normal.  Nose: Nose normal.  Mouth/Throat: Oropharynx is clear and moist.  Eyes: Conjunctivae and EOM are normal.  Neck: Neck supple.  Cardiovascular: Normal rate, regular rhythm and normal heart sounds. Exam reveals no gallop and no friction rub.  No murmur heard. Pulmonary/Chest: Effort normal and breath sounds normal. She has no wheezes. She has no rales.  Abdominal: Soft.  Neurological: She is alert and oriented to person, place, and time.  Skin: Skin is warm. Rash noted.  Multiple excoriation marks with few erythematous papular rash on her upper extremities.   Psychiatric: She has a normal mood and affect. Her behavior is normal.  Nursing note and vitals reviewed.  The plan was reviewed with the patient/family, and all questions/concerned were addressed.  It was my pleasure to see Samantha today and participate in her care. Please feel free to contact me with any questions or concerns.  Sincerely,  Wyline Mood, DO Allergy & Immunology  [facility] office: [phone] [facility] office: [phone]

## 2019-02-17 NOTE — Assessment & Plan Note (Signed)
Rash improved with allergy immunotherapy in the past.  Discussed proper skin care measures.   Will make additional recommendations after skin testing next week.

## 2019-02-22 ENCOUNTER — Other Ambulatory Visit: Payer: Self-pay

## 2019-02-22 ENCOUNTER — Ambulatory Visit (INDEPENDENT_AMBULATORY_CARE_PROVIDER_SITE_OTHER): Payer: Medicaid Other | Admitting: Allergy and Immunology

## 2019-02-22 ENCOUNTER — Encounter: Payer: Self-pay | Admitting: Allergy and Immunology

## 2019-02-22 VITALS — BP 110/68 | HR 92 | Temp 98.4°F | Resp 18

## 2019-02-22 DIAGNOSIS — R0609 Other forms of dyspnea: Secondary | ICD-10-CM

## 2019-02-22 DIAGNOSIS — Z720 Tobacco use: Secondary | ICD-10-CM

## 2019-02-22 DIAGNOSIS — J3089 Other allergic rhinitis: Secondary | ICD-10-CM

## 2019-02-22 DIAGNOSIS — J4530 Mild persistent asthma, uncomplicated: Secondary | ICD-10-CM | POA: Diagnosis not present

## 2019-02-22 MED ORDER — LEVOCETIRIZINE DIHYDROCHLORIDE 5 MG PO TABS: 5.0000 mg | ORAL_TABLET | Freq: Every evening | ORAL | 5 refills | Status: DC

## 2019-02-22 MED ORDER — FLUTICASONE PROPIONATE HFA 110 MCG/ACT IN AERO: 2.0000 | INHALATION_SPRAY | Freq: Two times a day (BID) | RESPIRATORY_TRACT | 5 refills | Status: AC

## 2019-02-22 MED ORDER — FLUTICASONE PROPIONATE 50 MCG/ACT NA SUSP: NASAL | 5 refills | Status: AC

## 2019-02-22 MED ORDER — EPINEPHRINE 0.3 MG/0.3ML IJ SOAJ: 0.3000 mg | Freq: Once | INTRAMUSCULAR | 2 refills | Status: AC

## 2019-02-22 NOTE — Assessment & Plan Note (Addendum)
The patients sensation of air-hunger, not being able to get a full breath on inspiration, which may eventually be relieved by a yawn suggests sighing dyspnea.   Diaphragmatic breathing, or belly breathing, has been discussed with the patient as this technique often times relieves sighing dyspnea.  The patient's progress will be followed and treatment plan will be adjusted accordingly.

## 2019-02-22 NOTE — Assessment & Plan Note (Signed)
   Continue Flovent 110 g, 2 inhalations via spacer device twice daily, and albuterol HFA, 1 to 2 inhalations every 4-6 hours if needed and 15 minutes prior to exercise.  Tobacco cessation has been discussed.  Subjective and objective measures of pulmonary function will be followed and the treatment plan will be adjusted accordingly.

## 2019-02-22 NOTE — Patient Instructions (Addendum)
Perennial and seasonal allergic rhinitis  Aeroallergen avoidance measures have been discussed and provided in written form.  A prescription has been provided for levocetirizine, 5 mg daily as needed.  Continue fluticasone nasal spray, 1 to 2 sprays per nostril daily as needed.  Nasal saline spray (i.e., Simply Saline) or nasal saline lavage (i.e., NeilMed) is recommended as needed and prior to medicated nasal sprays.  The risks and benefits of aeroallergen immunotherapy have been discussed. The patient is motivated to initiate immunotherapy to reduce symptoms and decrease medication requirement. Informed consent has been signed and allergen vaccine orders have been submitted. Medications will be decreased or discontinued as symptom relief from immunotherapy becomes evident.  Mild persistent asthma  Continue Flovent 110 g, 2 inhalations via spacer device twice daily, and albuterol HFA, 1 to 2 inhalations every 4-6 hours if needed and 15 minutes prior to exercise.  Tobacco cessation has been discussed.  Subjective and objective measures of pulmonary function will be followed and the treatment plan will be adjusted accordingly.  Other forms of dyspnea The patients sensation of air-hunger, not being able to get a full breath on inspiration, which may eventually be relieved by a yawn suggests sighing dyspnea.   Diaphragmatic breathing, or belly breathing, has been discussed with the patient as this technique often times relieves sighing dyspnea.  The patient's progress will be followed and treatment plan will be adjusted accordingly.   Return in about 3 months (around 05/25/2019), or if symptoms worsen or fail to improve.  Control of Dust Mite Allergen  House dust mites play a major role in allergic asthma and rhinitis.  They occur in environments with high humidity wherever human skin, the food for dust mites is found. High levels have been detected in dust obtained from mattresses,  pillows, carpets, upholstered furniture, bed covers, clothes and soft toys.  The principal allergen of the house dust mite is found in its feces.  A gram of dust may contain 1,000 mites and 250,000 fecal particles.  Mite antigen is easily measured in the air during house cleaning activities.    1. Encase mattresses, including the box spring, and pillow, in an air tight cover.  Seal the zipper end of the encased mattresses with wide adhesive tape. 2. Wash the bedding in water of 130 degrees Farenheit weekly.  Avoid cotton comforters/quilts and flannel bedding: the most ideal bed covering is the dacron comforter. 3. Remove all upholstered furniture from the bedroom. 4. Remove carpets, carpet padding, rugs, and non-washable window drapes from the bedroom.  Wash drapes weekly or use plastic window coverings. 5. Remove all non-washable stuffed toys from the bedroom.  Wash stuffed toys weekly. 6. Have the room cleaned frequently with a vacuum cleaner and a damp dust-mop.  The patient should not be in a room which is being cleaned and should wait 1 hour after cleaning before going into the room. 7. Close and seal all heating outlets in the bedroom.  Otherwise, the room will become filled with dust-laden air.  An electric heater can be used to heat the room. Reduce indoor humidity to less than 50%.  Do not use a humidifier.   Reducing Pollen Exposure  The American Academy of Allergy, Asthma and Immunology suggests the following steps to reduce your exposure to pollen during allergy seasons.    1. Do not hang sheets or clothing out to dry; pollen may collect on these items. 2. Do not mow lawns or spend time around freshly cut grass; mowing  stirs up pollen. 3. Keep windows closed at night.  Keep car windows closed while driving. 4. Minimize morning activities outdoors, a time when pollen counts are usually at their highest. 5. Stay indoors as much as possible when pollen counts or humidity is high and on  windy days when pollen tends to remain in the air longer. 6. Use air conditioning when possible.  Many air conditioners have filters that trap the pollen spores. 7. Use a HEPA room air filter to remove pollen form the indoor air you breathe.   Control of Dog or Cat Allergen  Avoidance is the best way to manage a dog or cat allergy. If you have a dog or cat and are allergic to dog or cats, consider removing the dog or cat from the home. If you have a dog or cat but don't want to find it a new home, or if your family wants a pet even though someone in the household is allergic, here are some strategies that may help keep symptoms at bay:  1. Keep the pet out of your bedroom and restrict it to only a few rooms. Be advised that keeping the dog or cat in only one room will not limit the allergens to that room. 2. Don't pet, hug or kiss the dog or cat; if you do, wash your hands with soap and water. 3. High-efficiency particulate air (HEPA) cleaners run continuously in a bedroom or living room can reduce allergen levels over time. 4. Place electrostatic material sheet in the air inlet vent in the bedroom. 5. Regular use of a high-efficiency vacuum cleaner or a central vacuum can reduce allergen levels. 6. Giving your dog or cat a bath at least once a week can reduce airborne allergen.   Control of Mold Allergen  Mold and fungi can grow on a variety of surfaces provided certain temperature and moisture conditions exist.  Outdoor molds grow on plants, decaying vegetation and soil.  The major outdoor mold, Alternaria and Cladosporium, are found in very high numbers during hot and dry conditions.  Generally, a late Summer - Fall peak is seen for common outdoor fungal spores.  Rain will temporarily lower outdoor mold spore count, but counts rise rapidly when the rainy period ends.  The most important indoor molds are Aspergillus and Penicillium.  Dark, humid and poorly ventilated basements are ideal sites  for mold growth.  The next most common sites of mold growth are the bathroom and the kitchen.  Outdoor [employer] 1. Use air conditioning and keep windows closed 2. Avoid exposure to decaying vegetation. 3. Avoid leaf raking. 4. Avoid grain handling. 5. Consider wearing a face mask if working in moldy areas.  Indoor Mold Control 1. Maintain humidity below 50%. 2. Clean washable surfaces with 5% bleach solution. 3. Remove sources e.g. Contaminated carpets.   Control of Cockroach Allergen  Cockroach allergen has been identified as an important cause of acute attacks of asthma, especially in urban settings.  There are fifty-five species of cockroach that exist in the [place], however only three, the [place], [place] species produce allergen that can affect patients with Asthma.  Allergens can be obtained from fecal particles, egg casings and secretions from cockroaches.    1. Remove food sources. 2. Reduce access to water. 3. Seal access and entry points. 4. Spray runways with 0.5-1% Diazinon or Chlorpyrifos 5. Blow boric acid power under stoves and refrigerator. 6. Place bait stations (hydramethylnon) at feeding sites.

## 2019-02-22 NOTE — Progress Notes (Addendum)
Follow-up Note  RE: Samantha Russell MRN: [id number] DOB: July 06, 1991 Date of Office Visit: 02/22/2019  Primary care provider: Kaleen Mask, MD Referring provider: Kaleen Mask, *  History of present illness: Samantha Russell is a 28 y.o. female with allergic rhinoconjunctivitis presenting today for follow-up and allergy skin testing.  She was previously seen in this clinic for her initial evaluation on Feb 17, 2019 by Dr. Selena Batten.  She was unable to undergo skin testing at that time due to recent administration of antihistamine.  She complains of nasal congestion, rhinorrhea, thick postnasal drainage, sinus pressure between the eyes, and ear fullness/pressure.  These symptoms occur year-round but may be more frequent and severe during the winter and springtime.  She attempts to control the symptoms with over-the-counter antihistamines.  She was on aero allergen immunotherapy injections in 2017 with benefit, however did not reach maintenance because she moved. Lanyiah has mild persistent asthma and is a cigarette smoker.  She reports that she "almost always" has bronchitis and a "smoker's cough".  She also complains of having difficulty taking a full deep breath it times.  After several attempts, she is typically able to achieve temporary relief with a deep, satisfying yawn.   Assessment and plan: Perennial and seasonal allergic rhinitis  Aeroallergen avoidance measures have been discussed and provided in written form.  A prescription has been provided for levocetirizine, 5 mg daily as needed.  Continue fluticasone nasal spray, 1 to 2 sprays per nostril daily as needed.  Nasal saline spray (i.e., Simply Saline) or nasal saline lavage (i.e., NeilMed) is recommended as needed and prior to medicated nasal sprays.  The risks and benefits of aeroallergen immunotherapy have been discussed. The patient is motivated to initiate immunotherapy to reduce symptoms and decrease medication  requirement. Informed consent has been signed and allergen vaccine orders have been submitted. Medications will be decreased or discontinued as symptom relief from immunotherapy becomes evident.  Mild persistent asthma  Continue Flovent 110 g, 2 inhalations via spacer device twice daily, and albuterol HFA, 1 to 2 inhalations every 4-6 hours if needed and 15 minutes prior to exercise.  Tobacco cessation has been discussed.  Subjective and objective measures of pulmonary function will be followed and the treatment plan will be adjusted accordingly.  Other forms of dyspnea The patients sensation of air-hunger, not being able to get a full breath on inspiration, which may eventually be relieved by a yawn suggests sighing dyspnea.   Diaphragmatic breathing, or belly breathing, has been discussed with the patient as this technique often times relieves sighing dyspnea.  The patient's progress will be followed and treatment plan will be adjusted accordingly.   Meds ordered this encounter  Medications  . levocetirizine (XYZAL) 5 MG tablet    Sig: Take 1 tablet (5 mg total) by mouth every evening.    Dispense:  30 tablet    Refill:  5  . fluticasone (FLONASE) 50 MCG/ACT nasal spray    Sig: Take 1-2 sprays daily    Dispense:  16 g    Refill:  5  . fluticasone (FLOVENT HFA) 110 MCG/ACT inhaler    Sig: Inhale 2 puffs into the lungs 2 (two) times a day.    Dispense:  1 Inhaler    Refill:  5  . EPINEPHrine (EPIPEN 2-PAK) 0.3 mg/0.3 mL IJ SOAJ injection    Sig: Inject 0.3 mLs (0.3 mg total) into the muscle once for 1 dose.    Dispense:  2 Device  Refill:  2    Diagnostics: Spirometry:  Normal with an FEV1 of 96% predicted.  Please see scanned spirometry results for details. Environmental skin testing: Positive to grass pollen, weed pollen, ragweed pollen, tree pollen, molds, cat hair, cockroach antigen, and dust mite antigen.    Physical examination: Blood pressure 110/68, pulse 92,  temperature 98.4 F (36.9 C), resp. rate 18, SpO2 100 %, unknown if currently breastfeeding.  General: Alert, interactive, in no acute distress. HEENT: TMs pearly gray, turbinates moderately edematous with clear discharge, post-pharynx erythematous. Neck: Supple without lymphadenopathy. Lungs: Clear to auscultation without wheezing, rhonchi or rales. CV: Normal S1, S2 without murmurs. Skin: Warm and dry, without lesions or rashes.  The following portions of the patient's history were reviewed and updated as appropriate: allergies, current medications, past family history, past medical history, past social history, past surgical history and problem list.  Allergies as of 02/22/2019      Reactions   Penicillins Other (See Comments)   Pt is unsure but has been told she is allergic      Medication List       Accurate as of Feb 22, 2019 12:36 PM. If you have any questions, ask your nurse or doctor.        albuterol 108 (90 Base) MCG/ACT inhaler Commonly known as:  VENTOLIN HFA Inhale 2 puffs into the lungs every 6 (six) hours as needed for wheezing or shortness of breath.   diphenhydrAMINE 25 MG tablet Commonly known as:  BENADRYL Take 25 mg by mouth every 6 (six) hours as needed.   EPINEPHrine 0.3 mg/0.3 mL Soaj injection Commonly known as:  EpiPen 2-Pak Inject 0.3 mLs (0.3 mg total) into the muscle once for 1 dose. Started by:  Wellington Hampshire Carter Evelena Masci, MD   fluticasone 110 MCG/ACT inhaler Commonly known as:  Flovent HFA Inhale 2 puffs into the lungs 2 (two) times a day.   fluticasone 50 MCG/ACT nasal spray Commonly known as:  Flonase Take 1-2 sprays daily   hydrOXYzine 50 MG capsule Commonly known as:  VISTARIL TAKE 1 2 CAPSULES BY MOUTH EVERY 8 HOURS AS NEEDED FOR PANIC ATTACKS MAY CAUSE SEDATION   lamoTRIgine 25 MG tablet Commonly known as:  LAMICTAL TAKE 1 TABLET BY MOUTH AT BEDTIME X 2 WEEKS THE 2 TABLETS AT BEDTIME   levocetirizine 5 MG tablet Commonly known as:   XYZAL Take 1 tablet (5 mg total) by mouth every evening. Started by:  Wellington Hampshire Carter Brion Hedges, MD   pantoprazole 40 MG tablet Commonly known as:  PROTONIX TAKE 1 TABLET BY MOUTH EVERY DAY FOR REFLUX   sertraline 100 MG tablet Commonly known as:  ZOLOFT Take by mouth.       Allergies  Allergen Reactions  . Penicillins Other (See Comments)    Pt is unsure but has been told she is allergic   Review of systems: Review of systems negative except as noted in HPI / PMHx or noted below: Constitutional: Negative.  HENT: Negative.   Eyes: Negative.  Respiratory: Negative.   Cardiovascular: Negative.  Gastrointestinal: Negative.  Genitourinary: Negative.  Musculoskeletal: Negative.  Neurological: Negative.  Endo/Heme/Allergies: Negative.  Cutaneous: Negative.  Past Medical History:  Diagnosis Date  . Bipolar 1 disorder ([facility])   . Stomach ulcer     Family History  Problem Relation Age of Onset  . Anxiety disorder Mother   . Depression Mother   . Heart disease Father   . Diabetes Maternal Grandmother   . Diabetes Paternal Grandmother  Social History   Socioeconomic History  . Marital status: Legally Separated    Spouse name: Not on file  . Number of children: Not on file  . Years of education: Not on file  . Highest education level: Not on file  Occupational History  . Not on file  Social Needs  . Financial resource strain: Not on file  . Food insecurity:    Worry: Not on file    Inability: Not on file  . Transportation needs:    Medical: Not on file    Non-medical: Not on file  Tobacco Use  . Smoking status: Current Every Day Smoker    Packs/day: 0.50    Years: 10.00    Pack years: 5.00    Types: Cigarettes  . Smokeless tobacco: Never Used  Substance and Sexual Activity  . Alcohol use: Yes    Comment: rare  . Drug use: No  . Sexual activity: Yes    Birth control/protection: None  Lifestyle  . Physical activity:    Days per week: Not on file    Minutes  per session: Not on file  . Stress: Not on file  Relationships  . Social connections:    Talks on phone: Not on file    Gets together: Not on file    Attends religious service: Not on file    Active member of club or organization: Not on file    Attends meetings of clubs or organizations: Not on file    Relationship status: Not on file  . Intimate partner violence:    Fear of current or ex partner: Not on file    Emotionally abused: Not on file    Physically abused: Not on file    Forced sexual activity: Not on file  Other Topics Concern  . Not on file  Social History Narrative  . Not on file    I appreciate the opportunity to take part in Yvette's care. Please do not hesitate to contact me with questions.  Sincerely,   R. Jorene Guest, MD

## 2019-02-22 NOTE — Assessment & Plan Note (Signed)
   Aeroallergen avoidance measures have been discussed and provided in written form.  A prescription has been provided for levocetirizine, 5 mg daily as needed.  Continue fluticasone nasal spray, 1 to 2 sprays per nostril daily as needed.  Nasal saline spray (i.e., Simply Saline) or nasal saline lavage (i.e., NeilMed) is recommended as needed and prior to medicated nasal sprays.  The risks and benefits of aeroallergen immunotherapy have been discussed. The patient is motivated to initiate immunotherapy to reduce symptoms and decrease medication requirement. Informed consent has been signed and allergen vaccine orders have been submitted. Medications will be decreased or discontinued as symptom relief from immunotherapy becomes evident.

## 2019-02-22 NOTE — Progress Notes (Signed)
VIALS EXP 02-23-2020 

## 2019-02-23 DIAGNOSIS — J3089 Other allergic rhinitis: Secondary | ICD-10-CM

## 2019-03-08 ENCOUNTER — Other Ambulatory Visit: Payer: Self-pay

## 2019-03-08 ENCOUNTER — Ambulatory Visit (INDEPENDENT_AMBULATORY_CARE_PROVIDER_SITE_OTHER): Payer: Medicaid Other

## 2019-03-08 DIAGNOSIS — J309 Allergic rhinitis, unspecified: Secondary | ICD-10-CM | POA: Diagnosis not present

## 2019-03-08 NOTE — Progress Notes (Signed)
Immunotherapy   Patient Details  Name: Samantha Russell MRN: [id number] Date of Birth: 08-Nov-1990  03/08/2019  [name] started her allergy injections today. Patient received 0.05 out of both her blue vials. One with Grass-Weeds-Tree-DM and the other with Mold-Cat-CR. Patient waited 30 minutes in an exam room with no problems. Following schedule:  A Frequency: once weekly Epi-Pen: Yes Consent signed and patient instructions given.   Dub Mikes 03/08/2019, 10:25 AM

## 2019-03-09 ENCOUNTER — Other Ambulatory Visit: Payer: Self-pay

## 2019-03-15 ENCOUNTER — Ambulatory Visit (INDEPENDENT_AMBULATORY_CARE_PROVIDER_SITE_OTHER): Payer: Medicaid Other

## 2019-03-15 ENCOUNTER — Other Ambulatory Visit: Payer: Self-pay

## 2019-03-15 DIAGNOSIS — J309 Allergic rhinitis, unspecified: Secondary | ICD-10-CM

## 2019-03-29 ENCOUNTER — Other Ambulatory Visit: Payer: Self-pay

## 2019-03-29 ENCOUNTER — Ambulatory Visit (INDEPENDENT_AMBULATORY_CARE_PROVIDER_SITE_OTHER): Payer: Medicaid Other

## 2019-03-29 DIAGNOSIS — J309 Allergic rhinitis, unspecified: Secondary | ICD-10-CM | POA: Diagnosis not present

## 2019-04-01 ENCOUNTER — Encounter: Payer: Self-pay | Admitting: Internal Medicine

## 2019-04-19 ENCOUNTER — Other Ambulatory Visit: Payer: Self-pay

## 2019-04-19 ENCOUNTER — Ambulatory Visit (INDEPENDENT_AMBULATORY_CARE_PROVIDER_SITE_OTHER): Payer: Medicaid Other

## 2019-04-19 DIAGNOSIS — J309 Allergic rhinitis, unspecified: Secondary | ICD-10-CM | POA: Diagnosis not present

## 2019-04-21 NOTE — Progress Notes (Deleted)
Referring Provider: Dr. Arelia Sneddon Primary Care Physician:  Leonard Downing, MD Primary Gastroenterologist:  Dr. Gala Romney  No chief complaint on file.   HPI:   Samantha Russell is a 28 y.o. female presenting today at the request of Dr. Arelia Sneddon for constipation and GERD.    anxiety attacks?  Optimize PPI therapy- timing, different PPI? Diet, lifestyle modifications  Past Medical History:  Diagnosis Date  . Bipolar 1 disorder ([place])   . Stomach ulcer     Past Surgical History:  Procedure Laterality Date  . DILATION AND CURETTAGE OF UTERUS      Current Outpatient Medications  Medication Sig Dispense Refill  . albuterol (VENTOLIN HFA) 108 (90 Base) MCG/ACT inhaler Inhale 2 puffs into the lungs every 6 (six) hours as needed for wheezing or shortness of breath. 1 Inhaler 1  . diphenhydrAMINE (BENADRYL) 25 MG tablet Take 25 mg by mouth every 6 (six) hours as needed.    . fluticasone (FLONASE) 50 MCG/ACT nasal spray Take 1-2 sprays daily 16 g 5  . fluticasone (FLOVENT HFA) 110 MCG/ACT inhaler Inhale 2 puffs into the lungs 2 (two) times a day. 1 Inhaler 5  . hydrOXYzine (VISTARIL) 50 MG capsule TAKE 1 2 CAPSULES BY MOUTH EVERY 8 HOURS AS NEEDED FOR PANIC ATTACKS MAY CAUSE SEDATION    . lamoTRIgine (LAMICTAL) 25 MG tablet TAKE 1 TABLET BY MOUTH AT BEDTIME X 2 WEEKS THE 2 TABLETS AT BEDTIME    . levocetirizine (XYZAL) 5 MG tablet Take 1 tablet (5 mg total) by mouth every evening. 30 tablet 5  . pantoprazole (PROTONIX) 40 MG tablet TAKE 1 TABLET BY MOUTH EVERY DAY FOR REFLUX    . sertraline (ZOLOFT) 100 MG tablet Take by mouth.     No current facility-administered medications for this visit.     Allergies as of 04/22/2019 - Review Complete 02/22/2019  Allergen Reaction Noted  . Penicillins Other (See Comments) 06/24/2015    Family History  Problem Relation Age of Onset  . Anxiety disorder Mother   . Depression Mother   . Heart disease Father   . Diabetes Maternal Grandmother    . Diabetes Paternal Grandmother     Social History   Socioeconomic History  . Marital status: Legally Separated    Spouse name: Not on file  . Number of children: Not on file  . Years of education: Not on file  . Highest education level: Not on file  Occupational History  . Not on file  Social Needs  . Financial resource strain: Not on file  . Food insecurity    Worry: Not on file    Inability: Not on file  . Transportation needs    Medical: Not on file    Non-medical: Not on file  Tobacco Use  . Smoking status: Current Every Day Smoker    Packs/day: 0.50    Years: 10.00    Pack years: 5.00    Types: Cigarettes  . Smokeless tobacco: Never Used  Substance and Sexual Activity  . Alcohol use: Yes    Comment: rare  . Drug use: No  . Sexual activity: Yes    Birth control/protection: None  Lifestyle  . Physical activity    Days per week: Not on file    Minutes per session: Not on file  . Stress: Not on file  Relationships  . Social Herbalist on phone: Not on file    Gets together: Not on file  Attends religious service: Not on file    Active member of club or organization: Not on file    Attends meetings of clubs or organizations: Not on file    Relationship status: Not on file  . Intimate partner violence    Fear of current or ex partner: Not on file    Emotionally abused: Not on file    Physically abused: Not on file    Forced sexual activity: Not on file  Other Topics Concern  . Not on file  Social History Narrative  . Not on file    Review of Systems: Gen: Denies any fever, chills, fatigue, weight loss, lack of appetite.  CV: Denies chest pain, heart palpitations, peripheral edema, syncope.  Resp: Denies shortness of breath at rest or with exertion. Denies wheezing or cough.  GI: Denies dysphagia or odynophagia. Denies jaundice, hematemesis, fecal incontinence. GU : Denies urinary burning, urinary frequency, urinary hesitancy MS: Denies  joint pain, muscle weakness, cramps, or limitation of movement.  Derm: Denies rash, itching, dry skin Psych: Denies depression, anxiety, memory loss, and confusion Heme: Denies bruising, bleeding, and enlarged lymph nodes.  Physical Exam: There were no vitals taken for this visit. General:   Alert and oriented. Pleasant and cooperative. Well-nourished and well-developed.  Head:  Normocephalic and atraumatic. Eyes:  Without icterus, sclera clear and conjunctiva pink.  Ears:  Normal auditory acuity. Nose:  No deformity, discharge,  or lesions. Mouth:  No deformity or lesions, oral mucosa pink.  Neck:  Supple, without mass or thyromegaly. Lungs:  Clear to auscultation bilaterally. No wheezes, rales, or rhonchi. No distress.  Heart:  S1, S2 present without murmurs appreciated.  Abdomen:  +BS, soft, non-tender and non-distended. No HSM noted. No guarding or rebound. No masses appreciated.  Rectal:  Deferred  Msk:  Symmetrical without gross deformities. Normal posture. Pulses:  Normal pulses noted. Extremities:  Without clubbing or edema. Neurologic:  Alert and  oriented x4;  grossly normal neurologically. Skin:  Intact without significant lesions or rashes. Cervical Nodes:  No significant cervical adenopathy. Psych:  Alert and cooperative. Normal mood and affect.

## 2019-04-22 ENCOUNTER — Ambulatory Visit: Payer: Medicaid Other | Admitting: Gastroenterology

## 2019-04-25 ENCOUNTER — Ambulatory Visit: Payer: Medicaid Other | Admitting: Nurse Practitioner

## 2019-04-27 ENCOUNTER — Ambulatory Visit: Payer: Medicaid Other | Admitting: Nurse Practitioner

## 2019-04-27 ENCOUNTER — Encounter: Payer: Self-pay | Admitting: Nurse Practitioner

## 2019-04-27 ENCOUNTER — Other Ambulatory Visit: Payer: Self-pay

## 2019-04-27 DIAGNOSIS — K5900 Constipation, unspecified: Secondary | ICD-10-CM | POA: Diagnosis not present

## 2019-04-27 DIAGNOSIS — K219 Gastro-esophageal reflux disease without esophagitis: Secondary | ICD-10-CM | POA: Diagnosis not present

## 2019-04-27 DIAGNOSIS — Z8619 Personal history of other infectious and parasitic diseases: Secondary | ICD-10-CM

## 2019-04-27 NOTE — Progress Notes (Signed)
Primary Care Physician:  Leonard Downing, MD Primary Gastroenterologist:  Dr. Gala Romney  Chief Complaint  Patient presents with   Constipation   Bloated   Gastroesophageal Reflux    HPI:   Samantha Russell is a 28 y.o. female who presents on referral from primary care for constipation.  Reviewed information provided with referral including referral request for IBS with constipation as well as GERD.  No history of colonoscopy or endoscopy in our system.  Today she states she's doing ok overall. When she took [place] she had diarrhea and frequent belching. Has been having constipation on/off for the last year since "my last stomach infection." Thinks it was an intestinal infection. Drinks almost no water, eats about half the amount of fiber that she should. Has a bowel movement about every 4-5 days; was going more on Linzess 290 mcg. She is not having diarrhea other than right after she takes Linzess. When she took the 72 mcg dose she had a similar effect ("pooped my brains out and belched for 3 days"). Did have stomach cramping and swelling. Has GERD symptoms intermittently, typically bad at night for a couple days, then go away and waxes and wanes like this persistently. States "it's either bad or not bad." On Protonix once daily. Had an EGD in [place], [place] but can't remember the doctor. She had "2 erosions in the esophagus and 2 stomach ulcers" and was on Nexium bid. Didn't have follow-up EGD after that. Switched off Nexium about 6 months ago (but didn't take it for a year before that); 6 months ago she was started on Protonix once daily. Takes NSAIDs "every once in a while" (about twice a month). Has intermittent nausea, typically in the morning. Denies other significant abdominal pain, vomiting, hematochezia, melena, fever, chills, unintentional weight loss. Denies URI or flu-like symptoms. Denies loss of sense of taste or smell. Denies chest pain, dyspnea, dizziness, lightheadedness,  syncope, near syncope. Denies any other upper or lower GI symptoms.  States she previously tested positive for Hepatitis C but was told it was a false positive. Has had "at home" tattoos.  Past Medical History:  Diagnosis Date   Bipolar 1 disorder ([place])    Multiple gastric ulcers    in [place], [place]   Stomach ulcer     Past Surgical History:  Procedure Laterality Date   DILATION AND CURETTAGE OF UTERUS     ESOPHAGOGASTRODUODENOSCOPY  2013   [place], [place]    Current Outpatient Medications  Medication Sig Dispense Refill   albuterol (VENTOLIN HFA) 108 (90 Base) MCG/ACT inhaler Inhale 2 puffs into the lungs every 6 (six) hours as needed for wheezing or shortness of breath. 1 Inhaler 1   diphenhydrAMINE (BENADRYL) 25 MG tablet Take 25 mg by mouth every 6 (six) hours as needed.     fluticasone (FLONASE) 50 MCG/ACT nasal spray Take 1-2 sprays daily 16 g 5   fluticasone (FLOVENT HFA) 110 MCG/ACT inhaler Inhale 2 puffs into the lungs 2 (two) times a day. 1 Inhaler 5   hydrOXYzine (VISTARIL) 50 MG capsule TAKE 1 2 CAPSULES BY MOUTH EVERY 8 HOURS AS NEEDED FOR PANIC ATTACKS MAY CAUSE SEDATION     lamoTRIgine (LAMICTAL) 100 MG tablet Take 200 mg by mouth daily.     levocetirizine (XYZAL) 5 MG tablet Take 1 tablet (5 mg total) by mouth every evening. 30 tablet 5   pantoprazole (PROTONIX) 40 MG tablet TAKE 1 TABLET BY MOUTH EVERY DAY FOR REFLUX  sertraline (ZOLOFT) 25 MG tablet Take 25 mg by mouth daily.     No current facility-administered medications for this visit.     Allergies as of 04/27/2019 - Review Complete 04/27/2019  Allergen Reaction Noted   Penicillins Other (See Comments) 06/24/2015    Family History  Problem Relation Age of Onset   Anxiety disorder Mother    Depression Mother    Heart disease Father    Diabetes Maternal Grandmother    Diabetes Paternal Grandmother    Colon cancer Neg Hx     Social History   Socioeconomic History   Marital  status: Legally Separated    Spouse name: Not on file   Number of children: Not on file   Years of education: Not on file   Highest education level: Not on file  Occupational History   Not on file  Social Needs   Financial resource strain: Not on file   Food insecurity    Worry: Not on file    Inability: Not on file   Transportation needs    Medical: Not on file    Non-medical: Not on file  Tobacco Use   Smoking status: Current Every Day Smoker    Packs/day: 0.50    Years: 10.00    Pack years: 5.00    Types: Cigarettes   Smokeless tobacco: Never Used  Substance and Sexual Activity   Alcohol use: Yes    Comment: rare   Drug use: No   Sexual activity: Yes    Birth control/protection: None  Lifestyle   Physical activity    Days per week: Not on file    Minutes per session: Not on file   Stress: Not on file  Relationships   Social connections    Talks on phone: Not on file    Gets together: Not on file    Attends religious service: Not on file    Active member of club or organization: Not on file    Attends meetings of clubs or organizations: Not on file    Relationship status: Not on file   Intimate partner violence    Fear of current or ex partner: Not on file    Emotionally abused: Not on file    Physically abused: Not on file    Forced sexual activity: Not on file  Other Topics Concern   Not on file  Social History Narrative   Not on file    Review of Systems: General: Negative for anorexia, weight loss, fever, chills, fatigue, weakness. ENT: Negative for hoarseness, difficulty swallowing. CV: Negative for chest pain, angina, palpitations, peripheral edema.  Respiratory: Negative for dyspnea at rest, cough, sputum, wheezing.  GI: See history of present illness. MS: Negative for joint pain, low back pain.  Derm: Negative for rash or itching.  Endo: Negative for unusual weight change.  Heme: Negative for bruising or bleeding. Allergy:  Negative for rash or hives.    Physical Exam: BP 111/74    Pulse 75    Temp 97.9 F (36.6 C) (Oral)    Ht 5\' 4"  (1.626 m)    Wt 180 lb (81.6 kg)    LMP 03/14/2019 (Approximate)    BMI 30.90 kg/m  General:   Alert and oriented. Pleasant and cooperative. Well-nourished and well-developed.  Head:  Normocephalic and atraumatic. Eyes:  Without icterus, sclera clear and conjunctiva pink.  Ears:  Normal auditory acuity. Cardiovascular:  S1, S2 present without murmurs appreciated. Extremities without clubbing or edema. Respiratory:  Clear to auscultation bilaterally. No wheezes, rales, or rhonchi. No distress.  Gastrointestinal:  +BS, soft, and non-distended. Mild epigastric TTP. No HSM noted. No guarding or rebound. No masses appreciated.  Rectal:  Deferred  Musculoskalatal:  Symmetrical without gross deformities. Skin:  Intact without significant lesions or rashes. Noted tattoos on upper extremities Neurologic:  Alert and oriented x4;  grossly normal neurologically. Psych:  Alert and cooperative. Normal mood and affect. Heme/Lymph/Immune: No excessive bruising noted.    04/27/2019 2:08 PM   Disclaimer: This note was dictated with voice recognition software. Similar sounding words can inadvertently be transcribed and may not be corrected upon review.

## 2019-04-27 NOTE — Assessment & Plan Note (Signed)
Patient has at least a 10-year history of GERD.  She had an EGD in [place] in about 2011 which she states found esophageal erosions and gastric and/or duodenal ulcers.  No repeat endoscopy was performed.  She was put on Nexium twice a day which she remained on until about a year and a half ago when she stopped taking it.  She was switched to Protonix 6 months ago and is taking that once a day currently.  She is still having breakthrough symptoms typically worse in the mornings when she wakes.  I will have her increase her Protonix to twice daily to see if this helps with overnight/morning time symptoms.  Follow-up in 2 months.

## 2019-04-27 NOTE — Patient Instructions (Addendum)
Your health issues we discussed today were:   Previous positive hepatitis C test: 1. We will check your hepatitis C antibody and your hepatitis C RNA level 2. We will give you results when we receive them 3. Further recommendations to follow 4. To prevent reinfection avoid tattoos from unclean sources, IV drug use, intranasal drug use, and other high risk behaviors.  See the attached info for further information  Constipation: 1. Stop taking Linzess 2. I am giving you samples of Amitiza 8 mcg.  Take this twice a day, on a full stomach 3. Call us in 1 to 2 weeks and let us know if it is helping your bowel movements and if there are any side effects 4. Increase the amount of water you drink every day.  Start taking a fiber supplement daily.  GERD (reflux/heartburn): 1. Increase Protonix to twice a day. 2. You can take the medication you have on hand twice a day. 3. You will run out early.  Call us and we can send a refill to your pharmacy for the twice a day dosing 4. Call us for any severe or worsening symptoms  Overall I recommend:  1. Continue your other current medications 2. Return for follow-up in 2 months 3. Call us if you have any questions or concerns       Hepatitis C  Hepatitis C is a viral infection of the liver. It can lead to scarring of the liver (cirrhosis), liver failure, or liver cancer. Hepatitis C may go undetected for months or years because people with the infection may not have symptoms, or they may have only mild symptoms. What are the causes? This condition is caused by the hepatitis C virus (HCV). The virus can spread from person to person (is contagious) through:  Blood.  Childbirth. A woman who has hepatitis C can pass it to her baby during birth.  Bodily fluids, such as breast milk, tears, semen, vaginal fluids, and saliva.  Blood transfusions or organ transplants done in the [place] before 1992. What increases the risk? The following  factors may make you more likely to develop this condition:  Having contact with unclean (contaminated) needles or syringes. This may result from: ? Acupuncture. ? Tattoing. ? Body piercing. ? Injecting drugs.  Having unprotected sex with someone who is infected.  Needing treatment to filter your blood (kidney dialysis).  Having HIV (human immunodeficiency virus) or AIDS (acquired immunodeficiency syndrome).  Working in a job that involves contact with blood or bodily fluids, such as health care. What are the signs or symptoms? Symptoms of this condition include:  Fatigue.  Loss of appetite.  Nausea.  Vomiting.  Abdominal pain.  Dark yellow urine.  Yellowish skin and eyes (jaundice).  Itchy skin.  Clay-colored bowel movements.  Joint pain.  Bleeding and bruising easily.  Fluid building up in your stomach (ascites). In some cases, you may not have any symptoms. How is this diagnosed? This condition is diagnosed with:  Blood tests.  Other tests to check how well your liver is functioning. They may include: ? Magnetic resonance elastography (MRE). This imaging test uses MRIs and sound waves to measure liver stiffness. ? Transient elastography. This imaging test uses ultrasounds to measure liver stiffness. ? Liver biopsy. This test requires taking a small tissue sample from your liver to examine it under a microscope. How is this treated? Your health care provider may perform noninvasive tests or a liver biopsy to help decide the best course of  treatment. Treatment may include:  Antiviral medicines and other medicines.  Follow-up treatments every 6-12 months for infections or other liver conditions.  Receiving a donated liver (liver transplant). Follow these instructions at home: Medicines  Take over-the-counter and prescription medicines only as told by your health care provider.  Take your antiviral medicine as told by your health care provider. Do not  stop taking the antiviral even if you start to feel better.  Do not take any medicines unless approved by your health care provider, including over-the-counter medicines and birth control pills. Activity  Rest as needed.  Do not have sex unless approved by your health care provider.  Ask your health care provider when you may return to school or work. Eating and drinking   Eat a balanced diet with plenty of fruits and vegetables, whole grains, and lowfat (lean) meats or non-meat proteins (such as beans or tofu).  Drink enough fluids to keep your urine clear or pale yellow.  Do not drink alcohol. General instructions  Do not share toothbrushes, nail clippers, or razors.  Wash your hands frequently with soap and water. If soap and water are not available, use hand sanitizer.  Cover any cuts or open sores on your skin to prevent spreading the virus.  Keep all follow-up visits as told by your health care provider. This is important. You may need follow-up visits every 6-12 months. How is this prevented? There is no vaccine for hepatitis C. The only way to prevent the disease is to reduce the risk of exposure to the virus. Make sure you:  Wash your hands frequently with soap and water. If soap and water are not available, use hand sanitizer.  Do not share needles or syringes.  Practice safe sex and use condoms.  Avoid handling blood or bodily fluids without gloves or other protection.  Avoid getting tattoos or piercings in shops or other locations that are not clean. Contact a health care provider if:  You have a fever.  You develop abdominal pain.  You pass dark urine.  You pass clay-colored stools.  You develop joint pain. Get help right away if:  You have increasing fatigue or weakness.  You lose your appetite.  You cannot eat or drink without vomiting.  You develop jaundice or your jaundice gets worse.  You bruise or bleed easily. Summary  Hepatitis C is  a viral infection of the liver. It can lead to scarring of the liver (cirrhosis), liver failure, or liver cancer.  The hepatitis C virus (HCV) causes this condition. The virus can pass from person to person (is contagious).  You should not take any medicines unless approved by your health care provider. This includes over-the-counter medicines and birth control pills. This information is not intended to replace advice given to you by your health care provider. Make sure you discuss any questions you have with your health care provider. Document Released: 09/26/2000 Document Revised: 09/11/2017 Document Reviewed: 11/04/2016 Elsevier Patient Education  2020 [employer].

## 2019-04-27 NOTE — Assessment & Plan Note (Signed)
The patient notes when she had her child she had an episode of acute jaundice.  She was told she had hepatitis C.  Follow-up testing found hepatitis C positive antibody but they told her it was "a false positive."  No further jaundice after her initial episode.  Query acute hepatitis C with spontaneous clearance.  I will check a hepatitis C antibody and RNA to document history versus active infection.  Follow-up in 2 months.

## 2019-04-27 NOTE — Progress Notes (Signed)
cc'ed to pcp °

## 2019-04-27 NOTE — Assessment & Plan Note (Signed)
Notes constipation and was previously having a bowel movement every 4 to 5 days.  She was given Linzess 290 mcg and Linzess 72 mcg.  Both of these caused side effects including bloating, belching, abdominal cramping.  Symptoms seem to be worse with the lower dose and higher dose.  Regardless, she is not tolerating the medicine per her description.  I will trial her on Amitiza 8 mcg twice daily on a full stomach.  Also recommend she increase her amount of water that she drinks daily and start a fiber supplement to ensure adequate hydration and fiber.  This will likely help with her constipation and adequate fiber diet has been shown to help prevent colorectal cancer.  Follow-up in 2 months.

## 2019-05-04 LAB — HCV RNA,QUANTITATIVE REAL TIME PCR
HCV Quantitative Log: <1.18 Log IU/mL — AB
HCV RNA, PCR, QN: <15 IU/mL — AB

## 2019-05-04 LAB — HEPATITIS C VRS RNA DETECT BY PCR-QUAL: Hepatitis C Vrs RNA by PCR-Qual: NOT DETECTED

## 2019-05-04 LAB — HEPATITIS C ANTIBODY
Hepatitis C Ab: REACTIVE — AB
SIGNAL TO CUT-OFF: 8.4 — ABNORMAL HIGH (ref <1.00)

## 2019-05-20 NOTE — Progress Notes (Signed)
Letter mailed to pt with the info from [place].

## 2019-05-31 ENCOUNTER — Ambulatory Visit: Payer: Medicaid Other | Admitting: Allergy and Immunology

## 2019-05-31 ENCOUNTER — Ambulatory Visit (INDEPENDENT_AMBULATORY_CARE_PROVIDER_SITE_OTHER): Payer: Medicaid Other

## 2019-05-31 ENCOUNTER — Other Ambulatory Visit: Payer: Self-pay

## 2019-05-31 DIAGNOSIS — J309 Allergic rhinitis, unspecified: Secondary | ICD-10-CM

## 2019-06-28 ENCOUNTER — Ambulatory Visit (INDEPENDENT_AMBULATORY_CARE_PROVIDER_SITE_OTHER): Payer: Medicaid Other | Admitting: Nurse Practitioner

## 2019-06-28 ENCOUNTER — Encounter: Payer: Self-pay | Admitting: Nurse Practitioner

## 2019-06-28 ENCOUNTER — Other Ambulatory Visit: Payer: Self-pay

## 2019-06-28 VITALS — BP 113/79 | HR 75 | Temp 96.6°F | Ht 64.0 in | Wt 189.2 lb

## 2019-06-28 DIAGNOSIS — R140 Abdominal distension (gaseous): Secondary | ICD-10-CM | POA: Diagnosis not present

## 2019-06-28 DIAGNOSIS — K219 Gastro-esophageal reflux disease without esophagitis: Secondary | ICD-10-CM

## 2019-06-28 DIAGNOSIS — K5900 Constipation, unspecified: Secondary | ICD-10-CM

## 2019-06-28 DIAGNOSIS — Z8619 Personal history of other infectious and parasitic diseases: Secondary | ICD-10-CM | POA: Diagnosis not present

## 2019-06-28 MED ORDER — LUBIPROSTONE 24 MCG PO CAPS: 24.0000 ug | ORAL_CAPSULE | Freq: Two times a day (BID) | ORAL | 0 refills | Status: DC

## 2019-06-28 NOTE — Patient Instructions (Signed)
Your health issues we discussed today were:   Constipation with bloating, abdominal discomfort: 1. Unfortunately we do not have samples in the office of the higher dose Amitiza 2. You can increase her Amitiza dose to 24 mcg twice daily.  Do this by taking 3 8 mcg pills in the morning, and another 3 in the evening 3. I am sending in a new prescription for Amitiza 24 mcg to last 1 month 4. Call us in 1 to 2 weeks and let us know if this is helping.  If so we can send in a longer term prescription 5. Call us for any problems or worsening symptoms  GERD (reflux/heartburn): 1. Avoid triggers that make your symptoms worse, such as dairy 2. Increase your Protonix to twice daily.  Take this once in the morning on empty stomach and in the evening 30 minutes before your last meal the day 3. Return for follow-up in 3 months  Previous positive hepatitis C test: 1. I have put in to recheck your hepatitis C RNA to ensure that there is no active infection 2. Have your labs tested completed the same as you can and we will call you with the results  Overall I recommend:  1. Continue your other current medications 2. Return for follow-up in 3 months 3. Call us if you have any questions or concerns.   Because of recent events of COVID-19 ("Coronavirus"), follow CDC recommendations:  1. Wash your hand frequently 2. Avoid touching your face 3. Stay away from people who are sick 4. If you have symptoms such as fever, cough, shortness of breath then call your healthcare provider for further guidance 5. If you are sick, STAY AT HOME unless otherwise directed by your healthcare provider. 6. Follow directions from state and national officials regarding staying safe   At [facility] we value your feedback. You may receive a survey about your visit today. Please share your experience as we strive to create trusting relationships with our patients to provide genuine, compassionate, quality care.   We appreciate your understanding and patience as we review any laboratory studies, imaging, and other diagnostic tests that are ordered as we care for you. Our office policy is 5 business days for review of these results, and any emergent or urgent results are addressed in a timely manner for your best interest. If you do not hear from our office in 1 week, please contact us.   We also encourage the use of MyChart, which contains your medical information for your review as well. If you are not enrolled in this feature, an access code is on this after visit summary for your convenience. Thank you for allowing [place] to be involved in your care.  It was great to see you today!  I hope you have a great Fall!!

## 2019-06-28 NOTE — Progress Notes (Signed)
Referring Provider: Kaleen Mask, * Primary Care Physician:  Kaleen Mask, MD Primary GI:  Dr. Jena Gauss  Chief Complaint  Patient presents with  . Gastroesophageal Reflux  . Constipation  . Bloated    HPI:   Samantha Russell is a 28 y.o. female who presents for follow-up on constipation and GERD.  The patient was last seen in our office 04/25/2019 for history of positive hepatitis C, constipation, GERD.  Previously tried Linzess which caused diarrhea and frequent belching.  On and off constipation for the past year since her stomach infection and thinks it was an intestinal infection, but no specifics available.  Drinks minimal to no water daily, 50% recommended fiber intake.  Bowel movement every 4 to 5 days.  Was started on Linzess 290 mcg and having diarrhea after this.  When she took a 72 mcg dose she had similar effect as well as abdominal cramping and swelling.  Intermittent GERD symptoms also noted that waxes and wanes persistently stating "it is either bad or not bad."  On Protonix daily.  EGD in [place], [place] cannot remember the doctor and noted "2 erosions in the esophagus into stomach ulcers" and was started on Nexium.  She did not have follow-up EGD and switch off Nexium about 6 months ago, although she did not take this for a year prior to that.  Started on Protonix daily.  Takes NSAIDs intermittently about twice a month.  Intermittent nausea in the morning, no other significant symptoms.  She also indicated she previously tested positive for hepatitis C but was told it was a false positive.  Recommended check hepatitis C antibody and RNA level, stop Linzess and start Amitiza 8 mcg twice daily with progress report in 1 to 2 weeks, increase Protonix to twice a day, follow-up in 2 months.  Labs completed 04/27/2019 which found positive hepatitis C antibody but negative hepatitis C RNA with 1 test and "less than 15 detected" with the other tests.  Likely hepatitis  C with spontaneous clearance and recommended recheck RNA at follow-up.  She asked if she should have her 74-year-old tested and recommended following up with child's pediatrician.  No further communication or progress reports from the patient.  Today she states she's doing ok overall. Having better bowel movements on Amitiza 8 mcg, no further diarrhea (as with Linzess) but still feels like she's not emptying. Also with associated nausea and bloating. GERD has good days and bad days, has breakthrough 2-3 times a week with GERD symptoms. Will have vomiting specifically with dairy and if she does have dairy she will have GERD and vomiting. Will have abdominal discomfort when her bloating gets bad. She is unsure if this improves with a bowel movement. Denies hematochezia, melena, fever, chills, unintentional weight loss. Denies URI or flu-like symptoms. Denies loss of sense of taste or smell.  She is taking protonix once daily, got confused with instructions to take twice a day.  Past Medical History:  Diagnosis Date  . Bipolar 1 disorder (HCC)   . Multiple gastric ulcers    in [place], [place]  . Stomach ulcer     Past Surgical History:  Procedure Laterality Date  . DILATION AND CURETTAGE OF UTERUS    . ESOPHAGOGASTRODUODENOSCOPY  2013   [place], [place]    Current Outpatient Medications  Medication Sig Dispense Refill  . albuterol (VENTOLIN HFA) 108 (90 Base) MCG/ACT inhaler Inhale 2 puffs into the lungs every 6 (six) hours as needed for wheezing  or shortness of breath. 1 Inhaler 1  . diphenhydrAMINE (BENADRYL) 25 MG tablet Take 25 mg by mouth every 6 (six) hours as needed.    . fluticasone (FLONASE) 50 MCG/ACT nasal spray Take 1-2 sprays daily 16 g 5  . fluticasone (FLOVENT HFA) 110 MCG/ACT inhaler Inhale 2 puffs into the lungs 2 (two) times a day. 1 Inhaler 5  . hydrOXYzine (VISTARIL) 50 MG capsule TAKE 1 2 CAPSULES BY MOUTH EVERY 8 HOURS AS NEEDED FOR PANIC ATTACKS MAY CAUSE SEDATION    .  lamoTRIgine (LAMICTAL) 100 MG tablet Take 200 mg by mouth daily.    Marland Kitchen levocetirizine (XYZAL) 5 MG tablet Take 1 tablet (5 mg total) by mouth every evening. 30 tablet 5  . pantoprazole (PROTONIX) 40 MG tablet TAKE 1 TABLET BY MOUTH EVERY DAY FOR REFLUX    . sertraline (ZOLOFT) 25 MG tablet Take 100 mg by mouth daily.     . Simethicone (GAS RELIEF PO) Take by mouth as needed.     No current facility-administered medications for this visit.     Allergies as of 06/28/2019 - Review Complete 06/28/2019  Allergen Reaction Noted  . Penicillins Other (See Comments) 06/24/2015    Family History  Problem Relation Age of Onset  . Anxiety disorder Mother   . Depression Mother   . Heart disease Father   . Diabetes Maternal Grandmother   . Diabetes Paternal Grandmother   . Colon cancer Neg Hx     Social History   Socioeconomic History  . Marital status: Legally Separated    Spouse name: Not on file  . Number of children: Not on file  . Years of education: Not on file  . Highest education level: Not on file  Occupational History  . Not on file  Social Needs  . Financial resource strain: Not on file  . Food insecurity    Worry: Not on file    Inability: Not on file  . Transportation needs    Medical: Not on file    Non-medical: Not on file  Tobacco Use  . Smoking status: Current Every Day Smoker    Packs/day: 0.50    Years: 10.00    Pack years: 5.00    Types: Cigarettes  . Smokeless tobacco: Never Used  Substance and Sexual Activity  . Alcohol use: Yes    Comment: rare  . Drug use: No  . Sexual activity: Yes    Birth control/protection: None  Lifestyle  . Physical activity    Days per week: Not on file    Minutes per session: Not on file  . Stress: Not on file  Relationships  . Social Musician on phone: Not on file    Gets together: Not on file    Attends religious service: Not on file    Active member of club or organization: Not on file    Attends  meetings of clubs or organizations: Not on file    Relationship status: Not on file  Other Topics Concern  . Not on file  Social History Narrative  . Not on file    Review of Systems: General: Negative for anorexia, weight loss, fever, chills, fatigue, weakness. ENT: Negative for hoarseness, difficulty swallowing. CV: Negative for chest pain, angina, palpitations, peripheral edema.  Respiratory: Negative for dyspnea at rest, cough, sputum, wheezing.  GI: See history of present illness. Endo: Negative for unusual weight change.  Heme: Negative for bruising or bleeding. Allergy: Negative for  rash or hives.   Physical Exam: BP 113/79   Pulse 75   Temp (!) 96.6 F (35.9 C) (Temporal)   Ht 5\' 4"  (1.626 m)   Wt 189 lb 3.2 oz (85.8 kg)   LMP 06/21/2019 (Approximate)   BMI 32.48 kg/m  General:   Alert and oriented. Pleasant and cooperative. Well-nourished and well-developed.  Eyes:  Without icterus, sclera clear and conjunctiva pink.  Ears:  Normal auditory acuity. Cardiovascular:  S1, S2 present without murmurs appreciated. Extremities without clubbing or edema. Respiratory:  Clear to auscultation bilaterally. No wheezes, rales, or rhonchi. No distress.  Gastrointestinal:  +BS, soft, non-tender and non-distended. No HSM noted. No guarding or rebound. No masses appreciated.  Rectal:  Deferred  Musculoskalatal:  Symmetrical without gross deformities. Neurologic:  Alert and oriented x4;  grossly normal neurologically. Psych:  Alert and cooperative. Normal mood and affect. Heme/Lymph/Immune: No excessive bruising noted.    06/28/2019 2:42 PM   Disclaimer: This note was dictated with voice recognition software. Similar sounding words can inadvertently be transcribed and may not be corrected upon review.

## 2019-06-28 NOTE — Assessment & Plan Note (Signed)
Constipation doing better on Amitiza 8 mcg twice daily.  Linzess previously caused diarrhea.  However, she is not optimally controlled and is still having sensation of incomplete emptying.  I recommended she increase her dose of Amitiza 24 mcg.  We do not have samples in the office will send a new prescription.  She can use the 8 mcg pills that she has ingested 3 in the morning and 3 in the afternoon.  I have re-educated her on the need to take this with a full stomach to prevent nausea.  Request progress report in 1 to 2 weeks.

## 2019-06-28 NOTE — Assessment & Plan Note (Signed)
History of positive hepatitis C antibody.  We rechecked her antibody and RNA.  Her antibody came back positive and the reflex RNA was negative.  However, the separate RNA lab found "less than 15" essentially appears to be RNA present but not at a significant number.  At this point I will recheck her hepatitis C RNA to ensure she does not have chronic hepatitis C.

## 2019-06-28 NOTE — Assessment & Plan Note (Signed)
Significant bloating and abdominal discomfort associated with constipation.  She is not sure if this improves with a bowel movement.  It is likely due to her constipation.  I feel when she is having better, more complete bowel movements her bloating will improve.  Further recommendations for constipation as per below.  Follow-up in 3 months.

## 2019-06-28 NOTE — Assessment & Plan Note (Signed)
Persistent GERD symptoms and flareups about 3 times a week.  She did not increase her Protonix to twice a day as previously recommended due to getting confused about her recommendations.  I am again recommending she increase her PPI to twice daily to see if this helps.  Return for follow-up in 3 months.  Call for any worsening or severe symptoms.

## 2019-07-03 NOTE — Progress Notes (Signed)
CC'ED TO PCP 

## 2019-09-26 ENCOUNTER — Other Ambulatory Visit: Payer: Self-pay | Admitting: Nurse Practitioner

## 2019-09-26 DIAGNOSIS — R140 Abdominal distension (gaseous): Secondary | ICD-10-CM

## 2019-09-26 DIAGNOSIS — K219 Gastro-esophageal reflux disease without esophagitis: Secondary | ICD-10-CM

## 2019-09-26 DIAGNOSIS — K5900 Constipation, unspecified: Secondary | ICD-10-CM

## 2019-09-26 DIAGNOSIS — Z8619 Personal history of other infectious and parasitic diseases: Secondary | ICD-10-CM

## 2019-09-27 ENCOUNTER — Telehealth: Payer: Self-pay

## 2019-09-27 ENCOUNTER — Encounter: Payer: Self-pay | Admitting: Nurse Practitioner

## 2019-09-27 ENCOUNTER — Other Ambulatory Visit: Payer: Self-pay

## 2019-09-27 ENCOUNTER — Ambulatory Visit (INDEPENDENT_AMBULATORY_CARE_PROVIDER_SITE_OTHER): Payer: Medicaid Other | Admitting: Nurse Practitioner

## 2019-09-27 VITALS — BP 112/76 | HR 82 | Temp 96.9°F | Ht 64.0 in | Wt 196.8 lb

## 2019-09-27 DIAGNOSIS — R109 Unspecified abdominal pain: Secondary | ICD-10-CM | POA: Diagnosis not present

## 2019-09-27 DIAGNOSIS — B1920 Unspecified viral hepatitis C without hepatic coma: Secondary | ICD-10-CM

## 2019-09-27 DIAGNOSIS — K5900 Constipation, unspecified: Secondary | ICD-10-CM | POA: Diagnosis not present

## 2019-09-27 DIAGNOSIS — Z8619 Personal history of other infectious and parasitic diseases: Secondary | ICD-10-CM

## 2019-09-27 DIAGNOSIS — R1084 Generalized abdominal pain: Secondary | ICD-10-CM

## 2019-09-27 NOTE — Patient Instructions (Signed)
Your health issues we discussed today were:   Abdominal pain with constipation: 1. Have your labs drawn as soon as you can 2. Pick up your prescription/refill of Amitiza 24 mcg twice daily and begin taking it as soon as you can.  This will help your constipation and likely help your abdominal pain somewhat 3. We will help schedule the CT scan of your abdomen and pelvis to further evaluate your worsening abdominal pain.  This will also help primary care and gynecology with their work-up 4. Follow-up with your primary care as scheduled 5. Follow-up with gynecology as scheduled/as needed 6. Call us if you have any worsening or severe symptoms  History of positive hepatitis C antibody test: 1. Have your hepatitis C RNA test completed.  This will help ensure that you do not have chronic, ongoing hepatitis C 2. Further recommendations to follow  Overall I recommend:  1. Continue your other current medications 2. Return for follow-up in 2 months 3. Call us if you have any questions or concerns.   Because of recent events of COVID-19 ("Coronavirus"), follow CDC recommendations:  1. Wash your hand frequently 2. Avoid touching your face 3. Stay away from people who are sick 4. If you have symptoms such as fever, cough, shortness of breath then call your healthcare provider for further guidance 5. If you are sick, STAY AT HOME unless otherwise directed by your healthcare provider. 6. Follow directions from state and national officials regarding staying safe   At [facility] we value your feedback. You may receive a survey about your visit today. Please share your experience as we strive to create trusting relationships with our patients to provide genuine, compassionate, quality care.  We appreciate your understanding and patience as we review any laboratory studies, imaging, and other diagnostic tests that are ordered as we care for you. Our office policy is 5 business days for  review of these results, and any emergent or urgent results are addressed in a timely manner for your best interest. If you do not hear from our office in 1 week, please contact us.   We also encourage the use of MyChart, which contains your medical information for your review as well. If you are not enrolled in this feature, an access code is on this after visit summary for your convenience. Thank you for allowing [place] to be involved in your care.  It was great to see you today!  I hope you have a Merry Christmas and Happy Holidays!!

## 2019-09-27 NOTE — Assessment & Plan Note (Signed)
The patient's chronic constipation.  Linzess caused diarrhea.  She was doing well on Amitiza 24 mcg twice daily.  However, she ran out and has not had this for over a week.  She is picking up a refill of her prescription in a couple days.  Recommend she continue to take Amitiza.  I feel her constipation, off Amitiza, is likely contributing to her abdominal pain although not the sole etiology, given her description of her symptoms.  Further recommendations as per above.  Follow-up in 2 months.

## 2019-09-27 NOTE — Assessment & Plan Note (Signed)
History of hepatitis C positive antibody which we confirmed.  Hepatitis C RNA found 1 test undetected in 1 "less than 15" but detected.  We will have her check a follow-up hepatitis C RNA to confirm whether or not she has chronic hepatitis C.  She likely either has a false positive antibody test or, more likely, had acute hepatitis C (which she remembers being jaundice) with spontaneous clearing and no ongoing chronic hepatitis C.  Further recommendations to follow.

## 2019-09-27 NOTE — Telephone Encounter (Signed)
PA for CT abd/pelvis w/contrast submitted via [facility]. Case pending. Service order: [id number]. Clinical notes faxed to [facility] and uploaded.

## 2019-09-27 NOTE — Assessment & Plan Note (Signed)
Some worsening of her abdominal pain.  This is now lower abdomen and crampy but it is also radiating through to her generalized abdomen and her bilateral flanks.  She does have a history of gynecological issues including ovarian cysts.  I feel her constipation could be contributing, especially now that she is off her medication, but not likely the sole etiology.  Given her ongoing, worsening abdominal pain I will highly recommend she pick up her refill of Amitiza and begin taking it.  We will check basic labs including a CBC, CMP.  I will also put in for CT of the abdomen and pelvis with contrast to further evaluate her symptoms.  This will also aid gynecology and primary care who both have appointments with the patient to help evaluate her abdominal pain.  Further recommendations to follow.  Call for any worsening symptoms and follow-up in 2 months otherwise.

## 2019-09-27 NOTE — Telephone Encounter (Signed)
CT abd/pelvis w/contrast scheduled for 10/12/19 at 12:00pm, arrive at 11:45am. NPO 4 hours before test. Pick up contrast before day of test.  Called and informed pt of appt. Letter mailed.

## 2019-09-27 NOTE — Progress Notes (Signed)
Referring Provider: Kaleen Mask, * Primary Care Physician:  Kaleen Mask, MD Primary GI:  Dr. Jena Gauss  Chief Complaint  Patient presents with  . Constipation    some better after starting Macrobid for blood in urine  . Nausea    HPI:   Samantha Russell is a 28 y.o. female who presents for follow-up on constipation and GERD.  The patient was last seen in our office 06/28/2019 for GERD, history of positive hepatitis C, constipation, bloating.  Previously tried and failed Linzess due to diarrhea.  Bowel movement every 4 to 5 days previously.  Even Linzess 72 mcg dose caused abdominal cramping and swelling.  History of GERD on Protonix daily.  Previous EGD in [place], [place] for which she feels they found to erosions and started her on Nexium.  At some point she decided to stop taking her Nexium, did not have follow-up EGD.  NSAIDs intermittently historically.  Positive hepatitis C antibody but negative RNA with 1 testing and "less than 15 detected" with the other test.  Likely hepatitis C with spontaneous clearance, recommended recheck RNA at follow-up.  At her last visit she noted better bowel movements on Amitiza 8 mcg, no further diarrhea but still not emptying completely.  Associated nausea and bloating.  GERD has good days and bad days with breakthrough 2-3 times a week.  Last vomiting with dairy associated with GERD.  Abdominal discomfort when bloating is bad, not sure if it improves with a bowel movement.  Taking Protonix once daily gets confused on the instructions and sometimes takes it twice a day.  Recommended increase Amitiza 24 mcg twice daily, a new prescription was sent to her pharmacy.  Also recommended increase Protonix to twice daily.  Recheck hepatitis C RNA.  Follow-up in 3 months.  It does not appear her recheck of hepatitis C RNA was completed.  Today she states she's doing ok overall. She has had a refill of Amitiza called in and will be picking  it up shortly; otherwise she has been out. She has been out for about a week. Is on Macrobid for possible UTI. Since Macrobid, stools not as bad. Abdominal pain is persistent, however. When she's taking Amitiza she has regular emptying and improved abdominal pain. Her abdominal pain now feels "like it's up in my ovaries and around the sides." She has an appointment with PCP tomorrow, may need hormone testing. Has intermittent nausea, sometimes decreased appetite associated with fullness. Denies vomiting, hematochezia, melena, fever, chills, unintentional weight loss. Denies URI or flu-like symptoms. Denies loss of sense of taste or smell. Denies chest pain, dyspnea, dizziness, lightheadedness, syncope, near syncope. Denies any other upper or lower GI symptoms.  Past Medical History:  Diagnosis Date  . Bipolar 1 disorder (HCC)   . Multiple gastric ulcers    in [place], [place]  . Stomach ulcer     Past Surgical History:  Procedure Laterality Date  . DILATION AND CURETTAGE OF UTERUS    . ESOPHAGOGASTRODUODENOSCOPY  2013   [place], [place]    Current Outpatient Medications  Medication Sig Dispense Refill  . albuterol (VENTOLIN HFA) 108 (90 Base) MCG/ACT inhaler Inhale 2 puffs into the lungs every 6 (six) hours as needed for wheezing or shortness of breath. 1 Inhaler 1  . diphenhydrAMINE (BENADRYL) 25 MG tablet Take 25 mg by mouth every 6 (six) hours as needed.    . fluticasone (FLONASE) 50 MCG/ACT nasal spray Take 1-2 sprays daily 16 g 5  .  fluticasone (FLOVENT HFA) 110 MCG/ACT inhaler Inhale 2 puffs into the lungs 2 (two) times a day. 1 Inhaler 5  . lamoTRIgine (LAMICTAL) 100 MG tablet Take 200 mg by mouth daily.    Marland Kitchen levocetirizine (XYZAL) 5 MG tablet Take 1 tablet (5 mg total) by mouth every evening. 30 tablet 5  . nitrofurantoin, macrocrystal-monohydrate, (MACROBID) 100 MG capsule Take 1 capsule by mouth 2 (two) times daily.    . pantoprazole (PROTONIX) 40 MG tablet TAKE 1 TABLET BY MOUTH EVERY  DAY FOR REFLUX    . sertraline (ZOLOFT) 25 MG tablet Take 100 mg by mouth daily.     . Simethicone (GAS RELIEF PO) Take by mouth as needed.    . AMITIZA 24 MCG capsule TAKE 1 CAPSULE (24 MCG TOTAL) BY MOUTH 2 (TWO) TIMES DAILY WITH A MEAL. (Patient not taking: Reported on 09/27/2019) 60 capsule 3   No current facility-administered medications for this visit.    Allergies as of 09/27/2019 - Review Complete 09/27/2019  Allergen Reaction Noted  . Penicillins Other (See Comments) 06/24/2015    Family History  Problem Relation Age of Onset  . Anxiety disorder Mother   . Depression Mother   . Heart disease Father   . Diabetes Maternal Grandmother   . Diabetes Paternal Grandmother   . Colon cancer Neg Hx     Social History   Socioeconomic History  . Marital status: Legally Separated    Spouse name: Not on file  . Number of children: Not on file  . Years of education: Not on file  . Highest education level: Not on file  Occupational History  . Not on file  Tobacco Use  . Smoking status: Current Every Day Smoker    Packs/day: 0.50    Years: 10.00    Pack years: 5.00    Types: Cigarettes  . Smokeless tobacco: Never Used  Substance and Sexual Activity  . Alcohol use: Yes    Comment: rare  . Drug use: No  . Sexual activity: Yes    Birth control/protection: None  Other Topics Concern  . Not on file  Social History Narrative  . Not on file   Social Determinants of Health   Financial Resource Strain:   . Difficulty of Paying Living Expenses: Not on file  Food Insecurity:   . Worried About Charity fundraiser in the Last Year: Not on file  . Ran Out of Food in the Last Year: Not on file  Transportation Needs:   . Lack of Transportation (Medical): Not on file  . Lack of Transportation (Non-Medical): Not on file  Physical Activity:   . Days of Exercise per Week: Not on file  . Minutes of Exercise per Session: Not on file  Stress:   . Feeling of Stress : Not on file   Social Connections:   . Frequency of Communication with Friends and Family: Not on file  . Frequency of Social Gatherings with Friends and Family: Not on file  . Attends Religious Services: Not on file  . Active Member of Clubs or Organizations: Not on file  . Attends Archivist Meetings: Not on file  . Marital Status: Not on file    Review of Systems: General: Negative for anorexia, weight loss, fever, chills, fatigue, weakness. ENT: Negative for hoarseness, difficulty swallowing. CV: Negative for chest pain, angina, palpitations, peripheral edema.  Respiratory: Negative for dyspnea at rest, cough, sputum, wheezing.  GI: See history of present illness. Endo: Negative  for unusual weight change.  Heme: Negative for bruising or bleeding. Allergy: Negative for rash or hives.   Physical Exam: BP 112/76   Pulse 82   Temp (!) 96.9 F (36.1 C) (Temporal)   Ht 5\' 4"  (1.626 m)   Wt 196 lb 12.8 oz (89.3 kg)   LMP 08/28/2019 (Approximate)   BMI 33.78 kg/m  General:   Alert and oriented. Pleasant and cooperative. Well-nourished and well-developed.  Eyes:  Without icterus, sclera clear and conjunctiva pink.  Ears:  Normal auditory acuity. Cardiovascular:  S1, S2 present without murmurs appreciated. Extremities without clubbing or edema. Respiratory:  Clear to auscultation bilaterally. No wheezes, rales, or rhonchi. No distress.  Gastrointestinal:  +BS, soft, and non-distended. Noted mild to moderate abdominal TTP. No HSM noted. No guarding or rebound. No masses appreciated.  Rectal:  Deferred  Musculoskalatal:  Symmetrical without gross deformities. Neurologic:  Alert and oriented x4;  grossly normal neurologically. Psych:  Alert and cooperative. Normal mood and affect. Heme/Lymph/Immune: No excessive bruising noted.    09/27/2019 3:18 PM   Disclaimer: This note was dictated with voice recognition software. Similar sounding words can inadvertently be transcribed and may  not be corrected upon review.

## 2019-09-28 ENCOUNTER — Encounter: Payer: Self-pay | Admitting: Internal Medicine

## 2019-09-29 NOTE — Telephone Encounter (Signed)
CT approved. PA# [id number], valid 09/27/19-03/25/20.

## 2019-10-02 LAB — CBC WITH DIFFERENTIAL/PLATELET
Absolute Monocytes: 476 cells/uL (ref 200–950)
Basophils Absolute: 41 cells/uL (ref 0–200)
Basophils Relative: 0.7 %
Eosinophils Absolute: 249 cells/uL (ref 15–500)
Eosinophils Relative: 4.3 %
HCT: 41.2 % (ref 35.0–45.0)
Hemoglobin: 13.8 g/dL (ref 11.7–15.5)
Lymphs Abs: 2262 cells/uL (ref 850–3900)
MCH: 30.4 pg (ref 27.0–33.0)
MCHC: 33.5 g/dL (ref 32.0–36.0)
MCV: 90.7 fL (ref 80.0–100.0)
MPV: 11.5 fL (ref 7.5–12.5)
Monocytes Relative: 8.2 %
Neutro Abs: 2772 cells/uL (ref 1500–7800)
Neutrophils Relative %: 47.8 %
Platelets: 240 10*3/uL (ref 140–400)
RBC: 4.54 10*6/uL (ref 3.80–5.10)
RDW: 12.9 % (ref 11.0–15.0)
Total Lymphocyte: 39 %
WBC: 5.8 10*3/uL (ref 3.8–10.8)

## 2019-10-02 LAB — COMPREHENSIVE METABOLIC PANEL
AG Ratio: 1.7 (calc) (ref 1.0–2.5)
ALT: 8 U/L (ref 6–29)
AST: 12 U/L (ref 10–30)
Albumin: 4.5 g/dL (ref 3.6–5.1)
Alkaline phosphatase (APISO): 73 U/L (ref 31–125)
BUN: 16 mg/dL (ref 7–25)
CO2: 26 mmol/L (ref 20–32)
Calcium: 8.8 mg/dL (ref 8.6–10.2)
Chloride: 107 mmol/L (ref 98–110)
Creat: 0.75 mg/dL (ref 0.50–1.10)
Globulin: 2.7 g/dL (calc) (ref 1.9–3.7)
Glucose, Bld: 99 mg/dL (ref 65–139)
Potassium: 4.1 mmol/L (ref 3.5–5.3)
Sodium: 142 mmol/L (ref 135–146)
Total Bilirubin: 0.6 mg/dL (ref 0.2–1.2)
Total Protein: 7.2 g/dL (ref 6.1–8.1)

## 2019-10-02 LAB — HCV RNA, QUANT REAL-TIME PCR W/REFLEX
HCV RNA, PCR, QN (Log): <1.18 LogIU/mL
HCV RNA, PCR, QN: <15 IU/mL

## 2019-10-04 ENCOUNTER — Other Ambulatory Visit: Payer: Self-pay

## 2019-10-04 NOTE — Progress Notes (Signed)
Noted, marked for scanning

## 2019-10-04 NOTE — Patient Instructions (Signed)
Lab results received from PCP as FYI. Placed in EG's box.

## 2019-10-12 ENCOUNTER — Encounter (HOSPITAL_COMMUNITY): Payer: Self-pay

## 2019-10-12 ENCOUNTER — Other Ambulatory Visit: Payer: Self-pay

## 2019-10-12 ENCOUNTER — Ambulatory Visit (HOSPITAL_COMMUNITY)
Admission: RE | Admit: 2019-10-12 | Discharge: 2019-10-12 | Disposition: A | Discharge status: Home / Self Care | Payer: Medicaid Other | Source: Ambulatory Visit | Attending: Nurse Practitioner | Admitting: Nurse Practitioner

## 2019-10-12 DIAGNOSIS — R1084 Generalized abdominal pain: Secondary | ICD-10-CM | POA: Diagnosis present

## 2019-10-12 DIAGNOSIS — Z8619 Personal history of other infectious and parasitic diseases: Secondary | ICD-10-CM | POA: Insufficient documentation

## 2019-10-12 DIAGNOSIS — K5900 Constipation, unspecified: Secondary | ICD-10-CM | POA: Insufficient documentation

## 2019-10-12 HISTORY — DX: Unspecified asthma, uncomplicated: J45.909

## 2019-10-12 MED ORDER — IOHEXOL 300 MG/ML  SOLN
100.0000 mL | Freq: Once | INTRAMUSCULAR | Status: AC | PRN
  Administered 2019-10-12: 100 mL via INTRAVENOUS

## 2019-11-18 ENCOUNTER — Emergency Department (HOSPITAL_COMMUNITY)
Admission: EM | Admit: 2019-11-18 | Discharge: 2019-11-18 | Disposition: A | Discharge status: Home / Self Care | Payer: Medicaid Other | Attending: Emergency Medicine | Admitting: Emergency Medicine

## 2019-11-18 ENCOUNTER — Encounter (HOSPITAL_COMMUNITY): Payer: Self-pay

## 2019-11-18 ENCOUNTER — Emergency Department (HOSPITAL_COMMUNITY): Payer: Medicaid Other

## 2019-11-18 ENCOUNTER — Other Ambulatory Visit: Payer: Self-pay

## 2019-11-18 DIAGNOSIS — J45909 Unspecified asthma, uncomplicated: Secondary | ICD-10-CM | POA: Diagnosis not present

## 2019-11-18 DIAGNOSIS — R109 Unspecified abdominal pain: Secondary | ICD-10-CM | POA: Diagnosis present

## 2019-11-18 DIAGNOSIS — Z79899 Other long term (current) drug therapy: Secondary | ICD-10-CM | POA: Insufficient documentation

## 2019-11-18 DIAGNOSIS — K529 Noninfective gastroenteritis and colitis, unspecified: Secondary | ICD-10-CM | POA: Insufficient documentation

## 2019-11-18 DIAGNOSIS — F17210 Nicotine dependence, cigarettes, uncomplicated: Secondary | ICD-10-CM | POA: Insufficient documentation

## 2019-11-18 LAB — CBC WITH DIFFERENTIAL/PLATELET
Abs Immature Granulocytes: 0.01 10*3/uL (ref 0.00–0.07)
Basophils Absolute: 0 10*3/uL (ref 0.0–0.1)
Basophils Relative: 0 %
Eosinophils Absolute: 0.3 10*3/uL (ref 0.0–0.5)
Eosinophils Relative: 4 %
HCT: 42.1 % (ref 36.0–46.0)
Hemoglobin: 14.1 g/dL (ref 12.0–15.0)
Immature Granulocytes: 0 %
Lymphocytes Relative: 36 %
Lymphs Abs: 2.8 10*3/uL (ref 0.7–4.0)
MCH: 30.9 pg (ref 26.0–34.0)
MCHC: 33.5 g/dL (ref 30.0–36.0)
MCV: 92.1 fL (ref 80.0–100.0)
Monocytes Absolute: 0.7 10*3/uL (ref 0.1–1.0)
Monocytes Relative: 8 %
Neutro Abs: 4 10*3/uL (ref 1.7–7.7)
Neutrophils Relative %: 52 %
Platelets: 313 10*3/uL (ref 150–400)
RBC: 4.57 MIL/uL (ref 3.87–5.11)
RDW: 12.9 % (ref 11.5–15.5)
WBC: 7.9 10*3/uL (ref 4.0–10.5)
nRBC: 0 % (ref 0.0–0.2)

## 2019-11-18 LAB — COMPREHENSIVE METABOLIC PANEL
ALT: 15 U/L (ref 0–44)
AST: 16 U/L (ref 15–41)
Albumin: 4.6 g/dL (ref 3.5–5.0)
Alkaline Phosphatase: 70 U/L (ref 38–126)
Anion gap: 11 (ref 5–15)
BUN: 18 mg/dL (ref 6–20)
CO2: 22 mmol/L (ref 22–32)
Calcium: 9.2 mg/dL (ref 8.9–10.3)
Chloride: 105 mmol/L (ref 98–111)
Creatinine, Ser: 0.71 mg/dL (ref 0.44–1.00)
GFR calc Af Amer: >60 mL/min (ref >60)
GFR calc non Af Amer: >60 mL/min (ref >60)
Glucose, Bld: 105 mg/dL — ABNORMAL HIGH (ref 70–99)
Potassium: 3.5 mmol/L (ref 3.5–5.1)
Sodium: 138 mmol/L (ref 135–145)
Total Bilirubin: 0.6 mg/dL (ref 0.3–1.2)
Total Protein: 7.9 g/dL (ref 6.5–8.1)

## 2019-11-18 LAB — LIPASE, BLOOD: Lipase: 21 U/L (ref 11–51)

## 2019-11-18 LAB — PROTIME-INR
INR: 1 (ref 0.8–1.2)
Prothrombin Time: 12.9 seconds (ref 11.4–15.2)

## 2019-11-18 MED ORDER — CIPROFLOXACIN HCL 500 MG PO TABS: 500.0000 mg | ORAL_TABLET | Freq: Two times a day (BID) | ORAL | 0 refills | Status: DC

## 2019-11-18 MED ORDER — CIPROFLOXACIN IN D5W 400 MG/200ML IV SOLN
400.0000 mg | Freq: Once | INTRAVENOUS | Status: AC
  Administered 2019-11-18: 400 mg via INTRAVENOUS
  Filled 2019-11-18: qty 200

## 2019-11-18 MED ORDER — PROMETHAZINE HCL 25 MG/ML IJ SOLN
12.5000 mg | Freq: Once | INTRAMUSCULAR | Status: AC
  Administered 2019-11-18: 12.5 mg via INTRAVENOUS
  Filled 2019-11-18: qty 1

## 2019-11-18 MED ORDER — MESALAMINE ER 500 MG PO CPCR: 500.0000 mg | ORAL_CAPSULE | Freq: Four times a day (QID) | ORAL | 0 refills | Status: DC

## 2019-11-18 MED ORDER — METRONIDAZOLE 500 MG PO TABS: 500.0000 mg | ORAL_TABLET | Freq: Two times a day (BID) | ORAL | 0 refills | Status: DC

## 2019-11-18 MED ORDER — IOHEXOL 300 MG/ML  SOLN
100.0000 mL | Freq: Once | INTRAMUSCULAR | Status: AC | PRN
  Administered 2019-11-18: 100 mL via INTRAVENOUS

## 2019-11-18 MED ORDER — PANTOPRAZOLE SODIUM 40 MG IV SOLR
INTRAVENOUS | Status: AC
  Filled 2019-11-18: qty 80

## 2019-11-18 MED ORDER — LACTATED RINGERS IV BOLUS
1000.0000 mL | Freq: Once | INTRAVENOUS | Status: AC
  Administered 2019-11-18: 1000 mL via INTRAVENOUS

## 2019-11-18 MED ORDER — MESALAMINE ER 250 MG PO CPCR
1000.0000 mg | ORAL_CAPSULE | Freq: Once | ORAL | Status: AC
  Administered 2019-11-18: 1000 mg via ORAL
  Filled 2019-11-18 (×2): qty 4

## 2019-11-18 MED ORDER — SODIUM CHLORIDE 0.9 % IV SOLN
80.0000 mg | Freq: Once | INTRAVENOUS | Status: AC
  Administered 2019-11-18: 80 mg via INTRAVENOUS
  Filled 2019-11-18: qty 80

## 2019-11-18 MED ORDER — OXYCODONE-ACETAMINOPHEN 5-325 MG PO TABS: 2.0000 | ORAL_TABLET | ORAL | 0 refills | Status: DC | PRN

## 2019-11-18 MED ORDER — FENTANYL CITRATE (PF) 100 MCG/2ML IJ SOLN
50.0000 ug | Freq: Once | INTRAMUSCULAR | Status: AC
  Administered 2019-11-18: 50 ug via INTRAVENOUS
  Filled 2019-11-18: qty 2

## 2019-11-18 MED ORDER — FAMOTIDINE IN NACL 20-0.9 MG/50ML-% IV SOLN
20.0000 mg | Freq: Once | INTRAVENOUS | Status: AC
  Administered 2019-11-18: 20 mg via INTRAVENOUS
  Filled 2019-11-18: qty 50

## 2019-11-18 MED ORDER — HYDROMORPHONE HCL 1 MG/ML IJ SOLN
1.0000 mg | Freq: Once | INTRAMUSCULAR | Status: AC
  Administered 2019-11-18: 1 mg via INTRAVENOUS
  Filled 2019-11-18: qty 1

## 2019-11-18 MED ORDER — DICYCLOMINE HCL 10 MG PO CAPS
10.0000 mg | ORAL_CAPSULE | Freq: Once | ORAL | Status: AC
  Administered 2019-11-18: 10 mg via ORAL
  Filled 2019-11-18: qty 1

## 2019-11-18 MED ORDER — METRONIDAZOLE IN NACL 5-0.79 MG/ML-% IV SOLN
500.0000 mg | Freq: Once | INTRAVENOUS | Status: AC
  Administered 2019-11-18: 500 mg via INTRAVENOUS
  Filled 2019-11-18: qty 100

## 2019-11-18 NOTE — ED Notes (Signed)
ED Provider at bedside. 

## 2019-11-18 NOTE — ED Triage Notes (Signed)
Pt reports severe abdominal pain all day radiating to back upper and lower abdomen. Pt reports multiple episodes of emesis and reports difficulty getting comfortable.

## 2019-11-18 NOTE — ED Provider Notes (Signed)
Emergency Department Provider Note   I have reviewed the triage vital signs and the nursing notes.   HISTORY  Chief Complaint Abdominal Pain   HPI Samantha Russell is a 29 y.o. female with a history of ulcers, bipolar asthma and constipation who presents the emergency department today with abdominal pain.  It appears from reviewing the records and the patient's history that she is abdominal pain for quite a while now but over the last few days specifically is progressively worsened to the point where she is bent over with pain.  Last 2 hours she had multiple episodes of nonbloody nonbilious vomiting.  She states that 3 days ago she had some clay colored stools then yesterday she had some dark tarry stools and then today she has had diminished stools but still passing gas.  She states that her stools today are more mucus in nature.  She states she supposed to medications for her stomach ulcers but she has not been taking them.  She is had no fevers or sick contacts.  No vaginal symptoms.  She does have what sounds like microscopic hematuria has been chronic in nature.  No other urinary symptoms.   No other associated or modifying symptoms.    Past Medical History:  Diagnosis Date  . Asthma   . Bipolar 1 disorder ([place])   . Multiple gastric ulcers    in [place], [place]  . Stomach ulcer     Patient Active Problem List   Diagnosis Date Noted  . Abdominal pain 09/27/2019  . Bloating 06/28/2019  . History of positive hepatitis C 04/27/2019  . Constipation 04/27/2019  . GERD (gastroesophageal reflux disease) 04/27/2019  . Other forms of dyspnea 02/22/2019  . Perennial and seasonal allergic rhinitis 02/17/2019  . Penicillin allergy 02/17/2019  . Tobacco user 02/17/2019  . Mild persistent asthma 02/17/2019  . Rash and other nonspecific skin eruption 02/17/2019    Past Surgical History:  Procedure Laterality Date  . DILATION AND CURETTAGE OF UTERUS    . ESOPHAGOGASTRODUODENOSCOPY   2013   [place], [place]    Current Outpatient Rx  . Order #: [id number] Class: Normal  . Order #: [id number] Class: Normal  . Order #: [id number] Class: Print  . Order #: [id number] Class: Historical Med  . Order #: [id number] Class: Normal  . Order #: [id number] Class: Normal  . Order #: [id number] Class: Historical Med  . Order #: [id number] Class: Normal  . Order #: [id number] Class: Print  . Order #: [id number] Class: Print  . Order #: [id number] Class: Print  . Order #: [id number] Class: Historical Med  . Order #: [id number] Class: Historical Med  . Order #: [id number] Class: Historical Med    Allergies Penicillins  Family History  Problem Relation Age of Onset  . Anxiety disorder Mother   . Depression Mother   . Heart disease Father   . Diabetes Maternal Grandmother   . Diabetes Paternal Grandmother   . Colon cancer Neg Hx     Social History Social History   Tobacco Use  . Smoking status: Current Every Day Smoker    Packs/day: 0.50    Years: 10.00    Pack years: 5.00    Types: Cigarettes  . Smokeless tobacco: Never Used  Substance Use Topics  . Alcohol use: Yes    Comment: rare  . Drug use: No    Review of Systems  All other systems negative except as documented in the HPI. All pertinent positives and negatives as reviewed in the HPI. ____________________________________________   PHYSICAL EXAM:  VITAL SIGNS:  Vitals:   11/18/19 0055 11/18/19 0056 11/18/19 0100 11/18/19 0500  BP: 119/85  129/88 122/80  Pulse: 70  90 75  Resp: 20   18  Temp: 98.2 F (36.8 C)     TempSrc: Oral     SpO2: 100%  99% 96%  Weight:  86.2 kg    Height:  5\' 4"  (1.626 m)    .  Constitutional: Alert and oriented. Appears to be in pain, bent over forward in bed for position of comfort.  Eyes: Conjunctivae are normal. PERRL. EOMI. Head: Atraumatic. Nose: No congestion/rhinnorhea. Mouth/Throat: Mucous membranes are moist.  Oropharynx non-erythematous. Neck: No stridor.  No meningeal signs.     Cardiovascular: Normal rate, regular rhythm. Good peripheral circulation. Grossly normal heart sounds.   Respiratory: Normal respiratory effort.  No retractions. Lungs CTAB. Gastrointestinal: Soft and diffusely tender without distension, rebound, guarding or other e/o peritonitis.  Musculoskeletal: No lower extremity tenderness nor edema. No gross deformities of extremities. Neurologic:  Normal speech and language. No gross focal neurologic deficits are appreciated.  Skin:  Skin is warm, dry and intact. No rash noted.  ____________________________________________   LABS (all labs ordered are listed, but only abnormal results are displayed)  Labs Reviewed  COMPREHENSIVE METABOLIC PANEL - Abnormal; Notable for the following components:      Result Value   Glucose, Bld 105 (*)    All other components within normal limits  CBC WITH DIFFERENTIAL/PLATELET  PROTIME-INR  LIPASE, BLOOD   ____________________________________________  EKG   EKG Interpretation  Date/Time:    Ventricular Rate:    PR Interval:    QRS Duration:   QT Interval:    QTC Calculation:   R Axis:     Text Interpretation:         ____________________________________________  RADIOLOGY  CT ABDOMEN PELVIS W CONTRAST  Result Date: 11/18/2019 CLINICAL DATA:  Abdominal pain. Multiple episodes of emesis EXAM: CT ABDOMEN AND PELVIS WITH CONTRAST TECHNIQUE: Multidetector CT imaging of the abdomen and pelvis was performed using the standard protocol following bolus administration of intravenous contrast. CONTRAST:  01/16/2020 OMNIPAQUE IOHEXOL 300 MG/ML  SOLN COMPARISON:  10/12/2019 FINDINGS: Lower chest: There is a stable pulmonary nodule in the right middle lobe.The heart size is normal. Hepatobiliary: The liver is normal. Normal gallbladder.There is no biliary ductal dilation. Pancreas: Normal contours without ductal dilatation. No peripancreatic fluid collection. Spleen: No splenic laceration or hematoma.  Adrenals/Urinary Tract: --Adrenal glands: No adrenal hemorrhage. --Right kidney/ureter: No hydronephrosis or perinephric hematoma. --Left kidney/ureter: No hydronephrosis or perinephric hematoma. --Urinary bladder: Unremarkable. Stomach/Bowel: --Stomach/Duodenum: No hiatal hernia or other gastric abnormality. Normal duodenal course and caliber. --Small bowel: No dilatation or inflammation. --Colon: There is suggestion of wall thickening involving the splenic flexure of the colon and descending colon. --Appendix: Normal. Vascular/Lymphatic: Normal course and caliber of the major abdominal vessels. --No retroperitoneal lymphadenopathy. --No mesenteric lymphadenopathy. --No pelvic or inguinal lymphadenopathy. Reproductive: Unremarkable Other: No ascites or free air. The abdominal wall is normal. Musculoskeletal. No acute displaced fractures. IMPRESSION: 1. Suggestion of wall thickening involving the splenic flexure of the colon and descending colon may be secondary to underdistention versus infectious or inflammatory colitis. Correlation with the patient's symptoms is recommended. 2. Normal appendix. Electronically Signed   By: 10/14/2019 M.D.   On: 11/18/2019 03:44    ____________________________________________   PROCEDURES  Procedure(s) performed:   Procedures   ____________________________________________   INITIAL IMPRESSION / ASSESSMENT AND PLAN / ED COURSE  With her symptoms one concern  would be for possible gastric ulcer bleed.  Could also be IBS versus inflammatory bowel disease.  She had a CT scan done back in December which was unremarkable.  We will try to avoid hold off on that now if we get her symptoms under control and her work-up is normal.  Patient with persistent pain so ct done showing likely colitis.  Could very well be inflammatory bowel disease with her history of dark stools and prolonged history of this happening.  Will start on mesalamine and antibiotics to cover  for infectious causes as well.  Will send a message to her GI provider for her follow-up appointment next week.    Pertinent labs & imaging results that were available during my care of the patient were reviewed by me and considered in my medical decision making (see chart for details).  A medical screening exam was performed and I feel the patient has had an appropriate workup for their chief complaint at this time and likelihood of emergent condition existing is low. They have been counseled on decision, discharge, follow up and which symptoms necessitate immediate return to the emergency department. They or their family verbally stated understanding and agreement with plan and discharged in stable condition.   ____________________________________________  FINAL CLINICAL IMPRESSION(S) / ED DIAGNOSES  Final diagnoses:  Colitis     MEDICATIONS GIVEN DURING THIS VISIT:  Medications  lactated ringers bolus 1,000 mL (0 mLs Intravenous Stopped 11/18/19 0246)  fentaNYL (SUBLIMAZE) injection 50 mcg (50 mcg Intravenous Given 11/18/19 0105)  dicyclomine (BENTYL) capsule 10 mg (10 mg Oral Given 11/18/19 0105)  promethazine (PHENERGAN) injection 12.5 mg (12.5 mg Intravenous Given 11/18/19 0105)  pantoprazole (PROTONIX) 80 mg in sodium chloride 0.9 % 100 mL IVPB (0 mg Intravenous Stopped 11/18/19 0246)  famotidine (PEPCID) IVPB 20 mg premix (0 mg Intravenous Stopped 11/18/19 0156)  HYDROmorphone (DILAUDID) injection 1 mg (1 mg Intravenous Given 11/18/19 0256)  iohexol (OMNIPAQUE) 300 MG/ML solution 100 mL (100 mLs Intravenous Contrast Given 11/18/19 0313)  mesalamine (PENTASA) CR capsule 1,000 mg (1,000 mg Oral Given 11/18/19 0528)  ciprofloxacin (CIPRO) IVPB 400 mg (0 mg Intravenous Stopped 11/18/19 0518)  metroNIDAZOLE (FLAGYL) IVPB 500 mg (0 mg Intravenous Stopped 11/18/19 0519)     NEW OUTPATIENT MEDICATIONS STARTED DURING THIS VISIT:  Discharge Medication List as of 11/18/2019  5:39 AM    START taking these  medications   Details  ciprofloxacin (CIPRO) 500 MG tablet Take 1 tablet (500 mg total) by mouth 2 (two) times daily. One po bid x 7 days, Starting Fri 11/18/2019, Print    mesalamine (PENTASA) 500 MG CR capsule Take 1 capsule (500 mg total) by mouth 4 (four) times daily for 7 days., Starting Fri 11/18/2019, Until Fri 11/25/2019, Print    metroNIDAZOLE (FLAGYL) 500 MG tablet Take 1 tablet (500 mg total) by mouth 2 (two) times daily. One po bid x 7 days, Starting Fri 11/18/2019, Print    oxyCODONE-acetaminophen (PERCOCET) 5-325 MG tablet Take 2 tablets by mouth every 4 (four) hours as needed., Starting Fri 11/18/2019, Print        Note:  This note was prepared with assistance of Dragon voice recognition software. Occasional wrong-word or sound-a-like substitutions may have occurred due to the inherent limitations of voice recognition software.   Karina Nofsinger, Barbara Cower, MD 11/18/19 [id number]

## 2019-11-22 ENCOUNTER — Other Ambulatory Visit: Payer: Self-pay

## 2019-11-22 ENCOUNTER — Ambulatory Visit (INDEPENDENT_AMBULATORY_CARE_PROVIDER_SITE_OTHER): Payer: Medicaid Other | Admitting: Nurse Practitioner

## 2019-11-22 ENCOUNTER — Encounter: Payer: Self-pay | Admitting: Internal Medicine

## 2019-11-22 ENCOUNTER — Encounter: Payer: Self-pay | Admitting: Nurse Practitioner

## 2019-11-22 ENCOUNTER — Telehealth: Payer: Self-pay

## 2019-11-22 VITALS — BP 124/86 | HR 77 | Temp 97.1°F | Ht 64.0 in | Wt 196.8 lb

## 2019-11-22 DIAGNOSIS — R1084 Generalized abdominal pain: Secondary | ICD-10-CM

## 2019-11-22 DIAGNOSIS — R935 Abnormal findings on diagnostic imaging of other abdominal regions, including retroperitoneum: Secondary | ICD-10-CM | POA: Diagnosis not present

## 2019-11-22 DIAGNOSIS — K5900 Constipation, unspecified: Secondary | ICD-10-CM | POA: Diagnosis not present

## 2019-11-22 DIAGNOSIS — K920 Hematemesis: Secondary | ICD-10-CM

## 2019-11-22 MED ORDER — ONDANSETRON HCL 4 MG PO TABS: 4.0000 mg | ORAL_TABLET | Freq: Three times a day (TID) | ORAL | 1 refills | Status: DC | PRN

## 2019-11-22 NOTE — Patient Instructions (Addendum)
Your health issues we discussed today were:   Abdominal pain and abnormal CT scan in the ER: 1. Continue your current medications 2. We will schedule your colonoscopy to further evaluate possible causes of the abnormal CT 3. Call us if you have any worsening or severe symptoms  Bleeding with vomiting in the setting of persistent nausea and vomiting: 1. I have sent Zofran 4 mg to your pharmacy.  You can take this every 4-6 hours as needed for nausea 2. Call us if you have any worsening or severe symptoms. 3. Specifically, call us if you see any more blood when you throw up or any more black/tarry stools  Constipation: 1. Stay on Amitiza 24 mcg twice daily for now until we can sort out the other issues 2. Call us if you have any worsening or severe constipation  Overall I recommend:  1. Continue your other current medications 2. Return for follow-up in 6 months 3. Call us if you have any questions or concerns   ---------------------------------------------------------------  COVID-19 Vaccine Information can be found at: [email or web address] For questions related to vaccine distribution or appointments, please email [email or web address] or call [phone].   ---------------------------------------------------------------   At [facility] we value your feedback. You may receive a survey about your visit today. Please share your experience as we strive to create trusting relationships with our patients to provide genuine, compassionate, quality care.  We appreciate your understanding and patience as we review any laboratory studies, imaging, and other diagnostic tests that are ordered as we care for you. Our office policy is 5 business days for review of these results, and any emergent or urgent results are addressed in a timely manner for your best interest. If you do not hear from our office in 1 week, please  contact us.   We also encourage the use of MyChart, which contains your medical information for your review as well. If you are not enrolled in this feature, an access code is on this after visit summary for your convenience. Thank you for allowing [place] to be involved in your care.  It was great to see you today!  I hope you have a great day!!

## 2019-11-22 NOTE — Progress Notes (Signed)
Referring Provider: Kaleen Mask, * Primary Care Physician:  Kaleen Mask, MD Primary GI:  Dr. Jena Gauss  Chief Complaint  Patient presents with  . Abdominal Pain    colitis,blood in emesis,some bm's had mucous in them and some were black tar looking    HPI:   Samantha Russell is a 29 y.o. female who presents for follow-up on constipation and abdominal pain.  The patient was last seen in our office 09/27/2019 for constipation, history of positive hepatitis C, generalized abdominal pain.  At that time noted previously tried and failed Linzess due to diarrhea, even with low-dose 72 mcg daily.  Off medication bowel movement every 4 to 5 days.  History of GERD on Protonix daily.  Previous EGD in [place], [place] and states they found erosions and started her on Nexium.  NSAIDs intermittently historically.  Positive hepatitis C antibody but unclear RNA testing.  Likely hepatitis C with spontaneous clearance.  At her last visit she was doing well overall, Amitiza working well at 24 mcg twice daily dose however she recently had run out.  Abdominal pain is persistent.  Improved pain and regular emptying on Amitiza.  Feels like her pain is "up in my ovaries and around the sides."  Has an appointment with primary care the day after her last visit.  Intermittent nausea, decreased appetite but no vomiting.  No other overt GI complaints.  Recommended have previously ordered labs completed, pickup refill of Amitiza, CT of the abdomen and pelvis, follow-up primary care and gynecology as scheduled, follow-up in 2 months.  Labs completed 09/27/2019 which found normal CBC, normal CMP, negative hepatitis C RNA.  CT of the abdomen and pelvis completed 10/12/2019 found negative study with no acute findings or significant abnormality.  Specifically in the stomach and bowel no evidence of obstruction, inflammatory process, or abnormal fluid collections.  It appears patient was in the  emergency room 11/18/2019.  She presented with worsening abdominal pain that is progressively worse and at that time severe.  Noted multiple episodes of nonbloody nonbilious vomiting.  Clay colored stools and dark tarry stools a couple days prior.  Stools the day of presentation are more mucus-like in nature.  Notes that she has not been taking her medications for her stomach ulcers.  Completed include normal CBC, essentially normal CMP, normal PT/INR, normal lipase.  CT imaging was repeated and found suggestion of wall thickening involving the splenic flexure of the colon and ascending colon which may be secondary to underdistention versus infectious or inflammatory colitis.  This is a new finding compared to her previous abdominal CT in December.  The ED provider felt that she could have inflammatory bowel disease with her history of dark stools and chronic abdominal pain.  She was started on antibiotics and mesalamine and referred to GI as an outpatient.  Today she states she's doing ok overall. States her abdominal pain is better overall. Has had frequent abdominal pain since childhood. History of PUD. Still with abdominal pain in the lower abdomen and epigastric pain. Seeing urology. Lower abdominal pain described of crampy, sharp, and dull. Has swelling. Epigastric pain is described as crampy/sharp. Epigastric pain worse with eating. Lower abdominal pain doesn't improve with a bowel movement. Still taking NSAIDs about 2-3 days a week, typically only once or twice a day. History of PUD (previous eosphageal erosions and gastric ulcers about 7 years ago. Persistent nausea, occasional vomiting. Has had hematemesis x 1 episode after 2 hours of persistent/constant  vomiting; at this point she went to the ER. Felt to be low volume hematemesis. Also with melena x 1 episode ("looked like roofing color") the day before going to the hospital. Stools otherwise mucousoid and orange. Seeing urology soon due to nitrates and  other UA abnormalities. Denies fever, chills, unintentional weight loss. Denies URI or flu-like symptoms. Denies loss of sense of taste or smell. Denies chest pain, dyspnea, dizziness, lightheadedness, syncope, near syncope. Denies any other upper or lower GI symptoms.  Has not started Pentasa. Is taking antibiotics as ordered, not completed yet.  Amitiza still working well, but a little less effective then previous; still taking it.  Past Medical History:  Diagnosis Date  . Asthma   . Bipolar 1 disorder (HCC)   . Multiple gastric ulcers    in [place], [place]  . Stomach ulcer     Past Surgical History:  Procedure Laterality Date  . DILATION AND CURETTAGE OF UTERUS    . ESOPHAGOGASTRODUODENOSCOPY  2013   [place], [place]    Current Outpatient Medications  Medication Sig Dispense Refill  . albuterol (VENTOLIN HFA) 108 (90 Base) MCG/ACT inhaler Inhale 2 puffs into the lungs every 6 (six) hours as needed for wheezing or shortness of breath. 1 Inhaler 1  . AMITIZA 24 MCG capsule TAKE 1 CAPSULE (24 MCG TOTAL) BY MOUTH 2 (TWO) TIMES DAILY WITH A MEAL. 60 capsule 3  . ciprofloxacin (CIPRO) 500 MG tablet Take 1 tablet (500 mg total) by mouth 2 (two) times daily. One po bid x 7 days 14 tablet 0  . diphenhydrAMINE (BENADRYL) 25 MG tablet Take 25 mg by mouth every 6 (six) hours as needed.    . fluticasone (FLONASE) 50 MCG/ACT nasal spray Take 1-2 sprays daily 16 g 5  . fluticasone (FLOVENT HFA) 110 MCG/ACT inhaler Inhale 2 puffs into the lungs 2 (two) times a day. 1 Inhaler 5  . lamoTRIgine (LAMICTAL) 100 MG tablet Take 200 mg by mouth daily.    . metroNIDAZOLE (FLAGYL) 500 MG tablet Take 1 tablet (500 mg total) by mouth 2 (two) times daily. One po bid x 7 days 14 tablet 0  . oxyCODONE-acetaminophen (PERCOCET) 5-325 MG tablet Take 2 tablets by mouth every 4 (four) hours as needed. 10 tablet 0  . pantoprazole (PROTONIX) 40 MG tablet 2 (two) times daily.     . sertraline (ZOLOFT) 25 MG tablet Take 100  mg by mouth daily.     . Simethicone (GAS RELIEF PO) Take by mouth as needed.    . mesalamine (PENTASA) 500 MG CR capsule Take 1 capsule (500 mg total) by mouth 4 (four) times daily for 7 days. (Patient not taking: Reported on 11/22/2019) 28 capsule 0   No current facility-administered medications for this visit.    Allergies as of 11/22/2019 - Review Complete 11/22/2019  Allergen Reaction Noted  . Penicillins Other (See Comments) 06/24/2015    Family History  Problem Relation Age of Onset  . Anxiety disorder Mother   . Depression Mother   . Heart disease Father   . Diabetes Maternal Grandmother   . Diabetes Paternal Grandmother   . Colon cancer Neg Hx     Social History   Socioeconomic History  . Marital status: Married    Spouse name: Not on file  . Number of children: Not on file  . Years of education: Not on file  . Highest education level: Not on file  Occupational History  . Not on file  Tobacco Use  .  Smoking status: Current Every Day Smoker    Packs/day: 0.50    Years: 10.00    Pack years: 5.00    Types: Cigarettes  . Smokeless tobacco: Never Used  Substance and Sexual Activity  . Alcohol use: Yes    Comment: rare  . Drug use: No  . Sexual activity: Yes    Birth control/protection: None  Other Topics Concern  . Not on file  Social History Narrative  . Not on file   Social Determinants of Health   Financial Resource Strain:   . Difficulty of Paying Living Expenses: Not on file  Food Insecurity:   . Worried About Charity fundraiser in the Last Year: Not on file  . Ran Out of Food in the Last Year: Not on file  Transportation Needs:   . Lack of Transportation (Medical): Not on file  . Lack of Transportation (Non-Medical): Not on file  Physical Activity:   . Days of Exercise per Week: Not on file  . Minutes of Exercise per Session: Not on file  Stress:   . Feeling of Stress : Not on file  Social Connections:   . Frequency of Communication with  Friends and Family: Not on file  . Frequency of Social Gatherings with Friends and Family: Not on file  . Attends Religious Services: Not on file  . Active Member of Clubs or Organizations: Not on file  . Attends Archivist Meetings: Not on file  . Marital Status: Not on file    Review of Systems: General: Negative for anorexia, weight loss, fever, chills, fatigue, weakness. ENT: Negative for hoarseness, difficulty swallowing. CV: Negative for chest pain, angina, palpitations, peripheral edema.  Respiratory: Negative for dyspnea at rest, cough, sputum, wheezing.  GI: See history of present illness. Endo: Negative for unusual weight change.  Heme: Negative for bruising or bleeding. Allergy: Negative for rash or hives.   Physical Exam: BP 124/86   Pulse 77   Temp (!) 97.1 F (36.2 C) (Temporal)   Ht 5\' 4"  (1.626 m)   Wt 196 lb 12.8 oz (89.3 kg)   LMP 11/12/2019 (Approximate)   BMI 33.78 kg/m  General:   Alert and oriented. Pleasant and cooperative. Well-nourished and well-developed.  Eyes:  Without icterus, sclera clear and conjunctiva pink.  Ears:  Normal auditory acuity. Cardiovascular:  S1, S2 present without murmurs appreciated. Extremities without clubbing or edema. Respiratory:  Clear to auscultation bilaterally. No wheezes, rales, or rhonchi. No distress.  Gastrointestinal:  +BS, soft, and non-distended. Mild epigastric TTP; mild LLQ TTP. No HSM noted. No guarding or rebound. No masses appreciated.  Rectal:  Deferred  Musculoskalatal:  Symmetrical without gross deformities. Neurologic:  Alert and oriented x4;  grossly normal neurologically. Psych:  Alert and cooperative. Normal mood and affect. Heme/Lymph/Immune: No excessive bruising noted.    11/22/2019 11:33 AM   Disclaimer: This note was dictated with voice recognition software. Similar sounding words can inadvertently be transcribed and may not be corrected upon review.

## 2019-11-22 NOTE — Assessment & Plan Note (Signed)
Chronic abdominal pain previously felt to be due to constipation with some improvement on Amitiza 24 mcg twice daily.  Her current Amitiza is still working, but not as well per the patient.  She did have acute on chronic abdominal pain for which she presented to the emergency room and an abnormal CT of the abdomen and pelvis with colonic wall thickening in the descending and sigmoid colon with radiologist noting possible infectious versus inflammatory disease.  She has been on antibiotics and is still taking them, I recommended she take these to completion.  She has not started Pentasa as it requires a prior authorization.  I am hesitant to start this at this time to be a definitive diagnosis.  Colonoscopy and upper endoscopy as per above.  Follow-up in 6 months.

## 2019-11-22 NOTE — Progress Notes (Signed)
Cc'ed to pcp °

## 2019-11-22 NOTE — Telephone Encounter (Signed)
Tried to call pt to schedule TCS/EGD w/Prop w/RMR, call went straight to VM. LMOVM for return call.

## 2019-11-22 NOTE — Assessment & Plan Note (Signed)
Constipation somewhat controlled on Amitiza.  Previously tried and failed Linzess.  Recommend she continue her current Amitiza 24 mcg twice daily.  If her constipation is indeed worsening this could be contributing to her abdominal pain.  Return for follow-up in 6 months.  Call for any worsening or severe symptoms.

## 2019-11-22 NOTE — Assessment & Plan Note (Addendum)
The patient recently presented to the emergency department with acute on chronic abdominal pain.  CT of the abdomen found colon wall thickening in the descending and sigmoid colon's.  Likely significant for colitis infectious versus inflammatory.  She started on antibiotics and Pentasa.  She has not started Pentasa because it required a prior authorization.  I am hesitant to start this for her until we can get a more definitive diagnosis.  I recommended a colonoscopy with possible biopsies to further evaluate for etiology.  We will plan for this 4 to 6 weeks after her CT (approximately early to mid March at the earliest) to allow for healing of any possible acute infectious etiology.  She is taking her antibiotics as recommended and recommended she take them to completion.  Further recommendations to follow.  Overall, it is notable that her abdominal pain is significantly improved after starting antibiotics.  Proceed with TCS on propofol/MAC with Dr. Gala Romney in near future: the risks, benefits, and alternatives have been discussed with the patient in detail. The patient states understanding and desires to proceed.  The patient is currently on Percocet, Zoloft.  No other anticoagulants, anxiolytics, chronic pain medications, antidepressants.  Social alcohol.  Denies recreational drugs.  We will plan for the procedure on propofol/MAC to promote adequate sedation.

## 2019-11-22 NOTE — Assessment & Plan Note (Addendum)
Patient noted one episode of small-volume hematemesis after 2 hours of frequent vomiting.  She does have a history of peptic ulcer disease on EGD in [place], [place].  She still takes NSAIDs.  Given her hematemesis with history of peptic ulcer disease I recommend that we repeat an upper endoscopy to evaluate for etiology.  She did also note 1 episode of black/tarry stools a day prior to presenting to the emergency department.  Her CBC was essentially normal.  Differentials include esophageal erosions, esophagitis, gastritis, duodenitis, peptic ulcer disease, Mallory-Weiss tear.  Less likely neoplasm.  I recommended she continue her current medications include Protonix twice a day.  Avoid NSAIDs and restrict over-the-counter pain medications to appropriate doses of Tylenol, which was explained to the patient.  Follow-up in 6 months.  Zofran to the pharmacy to help with nausea.  Proceed with EGD on propofol/MAC with Dr. Gala Romney in near future: the risks, benefits, and alternatives have been discussed with the patient in detail. The patient states understanding and desires to proceed.  The patient is currently on Percocet, Zoloft.  No other anticoagulants, anxiolytics, chronic pain medications, antidepressants, antidiabetics, or iron supplements.  Admits social alcohol use, denies drug use.  We will plan for the procedure on propofol/MAC to promote adequate sedation.

## 2019-11-23 ENCOUNTER — Telehealth: Payer: Self-pay | Admitting: Allergy and Immunology

## 2019-11-23 NOTE — Telephone Encounter (Signed)
I only saw her one time and that was in May 2020. She will need to follow up with me or one of the other providers for further evaluation. For now, continue using albuterol as needed and increased Flovent use to 3 inhalations via spacer device 3 times daily. Thanks.

## 2019-11-23 NOTE — Telephone Encounter (Signed)
Called and spoke with patient and she stated that she woke up this morning and was having hard time catching her breath. Patient used her albuterol and went to work at [employer]. Patient stated that she was still having hard time catching her breath and after speaking with her boss she is wondering if her work place could be the cause of her breath issues. She says she works 12 hour days there. Please advise.

## 2019-11-23 NOTE — Telephone Encounter (Signed)
Patient called to talk with a nurse to see if she is using her ventolin correctly. She woke up having trouble with her breathing and went to work and had to use her ventolin 4 times .[facility]. [phone].

## 2019-11-24 ENCOUNTER — Ambulatory Visit: Payer: Medicaid Other | Admitting: Allergy

## 2019-11-24 NOTE — Progress Notes (Deleted)
Follow Up Note  RE: [name] MRN: [id number] DOB: [birth year] Date of Office Visit: 11/24/2019  Referring provider: Kaleen Mask, * Primary care provider: Kaleen Mask, MD  Chief Complaint: No chief complaint on file.  History of Present Illness: I had the pleasure of seeing Samantha Russell for a follow up visit at the [facility] on 11/24/2019. She is a 29 y.o. female, who is being followed for allergic rhinitis, asthma and dyspnea. Her previous allergy office visit was on 02/22/2019 with Dr. Nunzio Cobbs. Today is a regular follow up visit.  Perennial and seasonal allergic rhinitis  Aeroallergen avoidance measures have been discussed and provided in written form.  A prescription has been provided for levocetirizine, 5 mg daily as needed.  Continue fluticasone nasal spray, 1 to 2 sprays per nostril daily as needed.  Nasal saline spray (i.e., Simply Saline) or nasal saline lavage (i.e., NeilMed) is recommended as needed and prior to medicated nasal sprays.  The risks and benefits of aeroallergen immunotherapy have been discussed. The patient is motivated to initiate immunotherapy to reduce symptoms and decrease medication requirement. Informed consent has been signed and allergen vaccine orders have been submitted. Medications will be decreased or discontinued as symptom relief from immunotherapy becomes evident.  Mild persistent asthma  Continue Flovent 110 g, 2 inhalations via spacer device twice daily, and albuterol HFA, 1 to 2 inhalations every 4-6 hours if needed and 15 minutes prior to exercise.  Tobacco cessation has been discussed.  Subjective and objective measures of pulmonary function will be followed and the treatment plan will be adjusted accordingly.  Other forms of dyspnea The patients sensation of air-hunger, not being able to get a full breath on inspiration, which may eventually be relieved by a yawn suggests sighing  dyspnea.   Diaphragmatic breathing, or belly breathing, has been discussed with the patient as this technique often times relieves sighing dyspnea.  The patient's progress will be followed and treatment plan will be adjusted accordingly.   Assessment and Plan: Samantha Russell is a 29 y.o. female with: No problem-specific Assessment & Plan notes found for this encounter.  No follow-ups on file.  No orders of the defined types were placed in this encounter.  Lab Orders  No laboratory test(s) ordered today    Diagnostics: Spirometry:  Tracings reviewed. Her effort: {Blank single:19197::"Good reproducible efforts.","It was hard to get consistent efforts and there is a question as to whether this reflects a maximal maneuver.","Poor effort, data can not be interpreted."} FVC: ***L FEV1: ***L, ***% predicted FEV1/FVC ratio: ***% Interpretation: {Blank single:19197::"Spirometry consistent with mild obstructive disease","Spirometry consistent with moderate obstructive disease","Spirometry consistent with severe obstructive disease","Spirometry consistent with possible restrictive disease","Spirometry consistent with mixed obstructive and restrictive disease","Spirometry uninterpretable due to technique","Spirometry consistent with normal pattern","No overt abnormalities noted given today's efforts"}.  Please see scanned spirometry results for details.  Skin Testing: {Blank single:19197::"Select foods","Environmental allergy panel","Environmental allergy panel and select foods","Food allergy panel","None","Deferred due to recent antihistamines use"}. Positive test to: ***. Negative test to: ***.  Results discussed with patient/family.   Medication List:  Current Outpatient Medications  Medication Sig Dispense Refill  . albuterol (VENTOLIN HFA) 108 (90 Base) MCG/ACT inhaler Inhale 2 puffs into the lungs every 6 (six) hours as needed for wheezing or shortness of breath. 1 Inhaler 1  . AMITIZA 24 MCG  capsule TAKE 1 CAPSULE (24 MCG TOTAL) BY MOUTH 2 (TWO) TIMES DAILY WITH A MEAL. 60 capsule 3  . ciprofloxacin (CIPRO) 500  MG tablet Take 1 tablet (500 mg total) by mouth 2 (two) times daily. One po bid x 7 days 14 tablet 0  . diphenhydrAMINE (BENADRYL) 25 MG tablet Take 25 mg by mouth every 6 (six) hours as needed.    . fluticasone (FLONASE) 50 MCG/ACT nasal spray Take 1-2 sprays daily 16 g 5  . fluticasone (FLOVENT HFA) 110 MCG/ACT inhaler Inhale 2 puffs into the lungs 2 (two) times a day. 1 Inhaler 5  . lamoTRIgine (LAMICTAL) 100 MG tablet Take 200 mg by mouth daily.    . mesalamine (PENTASA) 500 MG CR capsule Take 1 capsule (500 mg total) by mouth 4 (four) times daily for 7 days. (Patient not taking: Reported on 11/22/2019) 28 capsule 0  . metroNIDAZOLE (FLAGYL) 500 MG tablet Take 1 tablet (500 mg total) by mouth 2 (two) times daily. One po bid x 7 days 14 tablet 0  . ondansetron (ZOFRAN) 4 MG tablet Take 1 tablet (4 mg total) by mouth every 8 (eight) hours as needed for nausea or vomiting. 30 tablet 1  . oxyCODONE-acetaminophen (PERCOCET) 5-325 MG tablet Take 2 tablets by mouth every 4 (four) hours as needed. 10 tablet 0  . pantoprazole (PROTONIX) 40 MG tablet 2 (two) times daily.     . sertraline (ZOLOFT) 25 MG tablet Take 100 mg by mouth daily.     . Simethicone (GAS RELIEF PO) Take by mouth as needed.     No current facility-administered medications for this visit.   Allergies: Allergies  Allergen Reactions  . Penicillins Other (See Comments)    Pt is unsure but has been told she is allergic   I reviewed her past medical history, social history, family history, and environmental history and no significant changes have been reported from her previous visit.  Review of Systems  Constitutional: Negative for appetite change, chills, fever and unexpected weight change.  HENT: Negative for congestion and rhinorrhea.   Eyes: Negative for itching.  Respiratory: Negative for cough, chest  tightness, shortness of breath and wheezing.   Gastrointestinal: Negative for abdominal pain.  Skin: Negative for rash.  Allergic/Immunologic: Positive for environmental allergies.  Neurological: Negative for headaches.   Objective: LMP 11/12/2019 (Approximate)  There is no height or weight on file to calculate BMI. Physical Exam  Constitutional: She is oriented to person, place, and time. She appears well-developed and well-nourished.  HENT:  Head: Normocephalic and atraumatic.  Right Ear: External ear normal.  Left Ear: External ear normal.  Nose: Nose normal.  Mouth/Throat: Oropharynx is clear and moist.  Eyes: Conjunctivae and EOM are normal.  Cardiovascular: Normal rate, regular rhythm and normal heart sounds. Exam reveals no gallop and no friction rub.  No murmur heard. Pulmonary/Chest: Effort normal and breath sounds normal. She has no wheezes. She has no rales.  Musculoskeletal:     Cervical back: Neck supple.  Neurological: She is alert and oriented to person, place, and time.  Skin: Skin is warm. No rash noted.  Psychiatric: She has a normal mood and affect. Her behavior is normal.  Nursing note and vitals reviewed.  Previous notes and tests were reviewed. The plan was reviewed with the patient/family, and all questions/concerned were addressed.  It was my pleasure to see Chandel today and participate in her care. Please feel free to contact me with any questions or concerns.  Sincerely,  Rexene Alberts, DO Allergy & Immunology  [facility] office: [phone] [facility] office: [place]  [place] office: [phone]

## 2019-11-24 NOTE — Telephone Encounter (Signed)
Late entry- Called and informed patient on 11/23/2019 and made an appointment for 11/24/2019 with Dr. Selena Batten in our [facility] office.  Today 11/24/2019- Patient no showed to her appointment.

## 2019-11-28 ENCOUNTER — Telehealth: Payer: Self-pay | Admitting: Internal Medicine

## 2019-11-28 ENCOUNTER — Other Ambulatory Visit: Payer: Self-pay | Admitting: *Deleted

## 2019-11-28 ENCOUNTER — Encounter: Payer: Self-pay | Admitting: *Deleted

## 2019-11-28 MED ORDER — CLENPIQ 10-3.5-12 MG-GM -GM/160ML PO SOLN: 1.0000 | Freq: Once | ORAL | 0 refills | Status: AC

## 2019-11-28 NOTE — Telephone Encounter (Signed)
Pt called back and scheduled her procedure for 02/13/2020.  Pt is aware that I am mailing out prep instructions, Pre-op info and Covid screening information.  Pt voiced understanding.

## 2019-11-28 NOTE — Telephone Encounter (Signed)
Tried to call pt, no answer, LMOVM for return call. (had also tried to call pt 11/22/19 to schedule procedure, see other phone note).

## 2019-11-28 NOTE — Telephone Encounter (Signed)
Patient called asking when her procedure was going to be scheduled

## 2019-11-29 ENCOUNTER — Encounter: Payer: Self-pay | Admitting: *Deleted

## 2019-11-30 ENCOUNTER — Ambulatory Visit: Payer: Medicaid Other | Admitting: Nurse Practitioner

## 2020-01-17 ENCOUNTER — Telehealth: Payer: Self-pay | Admitting: Internal Medicine

## 2020-01-17 NOTE — Telephone Encounter (Signed)
Called pt and Left a detailed message for pt. Pt can call back if needed.

## 2020-01-17 NOTE — Progress Notes (Deleted)
Referring Provider: Kaleen Russell, * Primary Care Physician:  Samantha Mask, MD Primary GI Physician: Dr. Jena Russell  No chief complaint on file.   HPI:   Samantha Russell is a 29 y.o. female presenting today for ***with a history of constipation, abdominal pain, GERD, positive Hep C Ab but negative RNA. Per patient, prior EGD in [place], [place] and states they found erosions and started her on Nexium.   Prior evaluation of abdominal pain with labs in December 2020 revealed normal CBC, normal CMP. CT of the abdomen and pelvis completed 10/12/2019 found negative study with no acute findings or significant abnormality.  Specifically in the stomach and bowel no evidence of obstruction, inflammatory process, or abnormal fluid collections.  Seen in the ED on 11/18/19 for worsening abdominal pain with vomiting. Reported clay colored and dark tarry stools a couple days prior. Had not been taking her PPI. Normal CBC, essentially normal CMP, normal PT/INR, normal lipase.  CT imaging was repeated and found suggestion of wall thickening involving the splenic flexure of the colon and descending colon which may be secondary to underdistention versus infectious or inflammatory colitis. She was started on cipro and flagyl and mesalamine and referred back to GI.   Last seen in our office on 11/22/19. Felt abdominal pain was improving but lower abdominal pain and epigastric pain continued. Reported abdominal pain since childhood. History of PUD about 7 years ago. Epigastric pain worse with eating. Lower abdominal pain doesn't improve with a bowel movement. Still taking NSAIDs about 2-3 days a week. Persistent nausea, occasional vomiting. One episode of hematemesis after persistent/constant vomiting. Melena x 1. Was taking antibiotics prescribed by ED but had not started pentasa. Amitiza was still working well. Plans included continuing antibiotics, continue Amitiza, continue Protonix BID, avoid  NSAIDs, zofran sent to pharmacy, pursue EGD and TCS to evaluate hematemesis and abnormal CT findings.   Patient is currently scheduled for EGD/TCS on 02/13/20.   Telephone call 01/17/20 with patient reporting going to urgent care on Saturday due to vomiting and abdominal pain.  Abdominal pain was constant but waxed and waned in severity.  When asked if she had any sick contacts, patient stated her daughter was vomiting the other day.  Office visit was made for 01/18/2020.  Samantha Russell responded to telephone call thereafter recommending clear liquids for the next 24-48 hours, advance to brat diet thereafter then advance as tolerated.  Suspected possible self-limiting gastroenteritis.  Advised to call in 48 to 72 hours if no better.  If severe, proceed to the ER.  Today:      Past Medical History:  Diagnosis Date  . Asthma   . Bipolar 1 disorder (HCC)   . Multiple gastric ulcers    in [place], [place]  . Stomach ulcer     Past Surgical History:  Procedure Laterality Date  . DILATION AND CURETTAGE OF UTERUS    . ESOPHAGOGASTRODUODENOSCOPY  2013   [place], [place]    Current Outpatient Medications  Medication Sig Dispense Refill  . albuterol (VENTOLIN HFA) 108 (90 Base) MCG/ACT inhaler Inhale 2 puffs into the lungs every 6 (six) hours as needed for wheezing or shortness of breath. 1 Inhaler 1  . AMITIZA 24 MCG capsule TAKE 1 CAPSULE (24 MCG TOTAL) BY MOUTH 2 (TWO) TIMES DAILY WITH A MEAL. 60 capsule 3  . ciprofloxacin (CIPRO) 500 MG tablet Take 1 tablet (500 mg total) by mouth 2 (two) times daily. One po bid x 7 days 14  tablet 0  . diphenhydrAMINE (BENADRYL) 25 MG tablet Take 25 mg by mouth every 6 (six) hours as needed.    . fluticasone (FLONASE) 50 MCG/ACT nasal spray Take 1-2 sprays daily 16 g 5  . fluticasone (FLOVENT HFA) 110 MCG/ACT inhaler Inhale 2 puffs into the lungs 2 (two) times a day. 1 Inhaler 5  . lamoTRIgine (LAMICTAL) 100 MG tablet Take 200 mg by mouth daily.    . mesalamine (PENTASA)  500 MG CR capsule Take 1 capsule (500 mg total) by mouth 4 (four) times daily for 7 days. (Patient not taking: Reported on 11/22/2019) 28 capsule 0  . metroNIDAZOLE (FLAGYL) 500 MG tablet Take 1 tablet (500 mg total) by mouth 2 (two) times daily. One po bid x 7 days 14 tablet 0  . ondansetron (ZOFRAN) 4 MG tablet Take 1 tablet (4 mg total) by mouth every 8 (eight) hours as needed for nausea or vomiting. 30 tablet 1  . oxyCODONE-acetaminophen (PERCOCET) 5-325 MG tablet Take 2 tablets by mouth every 4 (four) hours as needed. 10 tablet 0  . pantoprazole (PROTONIX) 40 MG tablet 2 (two) times daily.     . sertraline (ZOLOFT) 25 MG tablet Take 100 mg by mouth daily.     . Simethicone (GAS RELIEF PO) Take by mouth as needed.     No current facility-administered medications for this visit.    Allergies as of 01/18/2020 - Review Complete 11/22/2019  Allergen Reaction Noted  . Penicillins Other (See Comments) 06/24/2015    Family History  Problem Relation Age of Onset  . Anxiety disorder Mother   . Depression Mother   . Heart disease Father   . Diabetes Maternal Grandmother   . Diabetes Paternal Grandmother   . Colon cancer Neg Hx     Social History   Socioeconomic History  . Marital status: Married    Spouse name: Not on file  . Number of children: Not on file  . Years of education: Not on file  . Highest education level: Not on file  Occupational History  . Not on file  Tobacco Use  . Smoking status: Current Every Day Smoker    Packs/day: 0.50    Years: 10.00    Pack years: 5.00    Types: Cigarettes  . Smokeless tobacco: Never Used  Substance and Sexual Activity  . Alcohol use: Yes    Comment: rare  . Drug use: No  . Sexual activity: Yes    Birth control/protection: None  Other Topics Concern  . Not on file  Social History Narrative  . Not on file   Social Determinants of Health   Financial Resource Strain:   . Difficulty of Paying Living Expenses:   Food Insecurity:     . Worried About Charity fundraiser in the Last Year:   . Arboriculturist in the Last Year:   Transportation Needs:   . Film/video editor (Medical):   Marland Kitchen Lack of Transportation (Non-Medical):   Physical Activity:   . Days of Exercise per Week:   . Minutes of Exercise per Session:   Stress:   . Feeling of Stress :   Social Connections:   . Frequency of Communication with Friends and Family:   . Frequency of Social Gatherings with Friends and Family:   . Attends Religious Services:   . Active Member of Clubs or Organizations:   . Attends Archivist Meetings:   Marland Kitchen Marital Status:  Review of Systems: Gen: Denies fever, chills, anorexia. Denies fatigue, weakness, weight loss.  CV: Denies chest pain, palpitations, syncope, peripheral edema, and claudication. Resp: Denies dyspnea at rest, cough, wheezing, coughing up blood, and pleurisy. GI: Denies vomiting blood, jaundice, and fecal incontinence.   Denies dysphagia or odynophagia. Derm: Denies rash, itching, dry skin Psych: Denies depression, anxiety, memory loss, confusion. No homicidal or suicidal ideation.  Heme: Denies bruising, bleeding, and enlarged lymph nodes.  Physical Exam: There were no vitals taken for this visit. General:   Alert and oriented. No distress noted. Pleasant and cooperative.  Head:  Normocephalic and atraumatic. Eyes:  Conjuctiva clear without scleral icterus. Mouth:  Oral mucosa pink and moist. Good dentition. No lesions. Heart:  S1, S2 present without murmurs appreciated. Lungs:  Clear to auscultation bilaterally. No wheezes, rales, or rhonchi. No distress.  Abdomen:  +BS, soft, non-tender and non-distended. No rebound or guarding. No HSM or masses noted. Msk:  Symmetrical without gross deformities. Normal posture. Extremities:  Without edema. Neurologic:  Alert and  oriented x4 Psych:  Alert and cooperative. Normal mood and affect.

## 2020-01-17 NOTE — Telephone Encounter (Signed)
Please advise if pts apt should be virtual in the morning that [facility] scheduled.

## 2020-01-17 NOTE — Telephone Encounter (Signed)
EG spoke with pt. Pt wen to Urgent Care Saturday due to vomiting and abdominal pain. Abdominal pain is constant but the severity of pain comes and goes from strong to mild. Pt is taking the Amitiza as directed and having BM daily. Pt states this morning her BM looked like clay and her abdomen hurt. Pt was asked if she was having any other symptoms. Pt feels warm but couldn't find her thermometer. Pt was asked if anyone else has been sick around her, pt states her daughter was vomiting the other day and the urgent care doctor has seen some viruses going around. Pt felt it's more of an infection with her and not an infection.

## 2020-01-17 NOTE — Telephone Encounter (Signed)
Pt called asking to speak with the nurse. I told her that I would have to take a message for her to call her back.  She went to Urgent Care for abd pain and was recommended to follow up with GI. She is already scheduled a procedure in May and I offered to make her an OV, but she would rather speak to the nurse first. Pleaes advise. [phone]

## 2020-01-17 NOTE — Telephone Encounter (Signed)
Have her start clear liquids for the next 24-48 hours. She can advance to a BRAT diet after that, and further advance as tolerated. Drink plenty of fluids. It may well be a self-limiting gastroenteritis.  If she is not feeling any better in the next 48-72 hours, call us back. If symptoms get SEVERE, she can go to the ER if needed.  Call with any questions or concerns.

## 2020-01-17 NOTE — Telephone Encounter (Signed)
Pt called back and agreed to make OV. She is aware of OV tomorrow.

## 2020-01-18 ENCOUNTER — Ambulatory Visit: Payer: Medicaid Other | Admitting: Gastroenterology

## 2020-01-18 NOTE — Telephone Encounter (Signed)
Please call patient. If she feels she still needs an office visit today after EG recommendations yesterday afternoon, yes we can do a virtual visit. Otherwise, she should continue with Eric's recommendations and monitor her symptoms for the next 24-48 hours.

## 2020-01-18 NOTE — Telephone Encounter (Signed)
I didn't get this message until after I made her a no show.

## 2020-01-18 NOTE — Telephone Encounter (Signed)
Spoke with patient. States she has chronic nausea and intermittent abdominal pain. She is tolerating clear liquids. She has zofran at home to help with nausea. No vomiting at this time. States she gets flares of nausea and abdominal pain intermittently. Bowels are doing ok. No fever. Discussed option of being seen in office today vs virtual visit vs monitoring for improvement now that she has backed down on her diet and tolerating clear liquids. She would like to continue to monitor for the next day or two. She was advised to call us back if symptoms do not improve and we would be glad to see her in the office. Also advised if she had significant worsening of symptoms, she should proceed to the emergency room.   Patient voiced her understanding and was appreciative of the call.   Bryon Lions please cancel patients office visit today.

## 2020-01-18 NOTE — Telephone Encounter (Signed)
FYI Lmom, waiting on a return call. Pt needs a telephone of [facility] if she still needs to be seen.

## 2020-01-18 NOTE — Telephone Encounter (Signed)
Pt called back this morning and spoke with someone about her apt. The no show can be changed.

## 2020-01-24 ENCOUNTER — Other Ambulatory Visit: Payer: Self-pay

## 2020-01-24 ENCOUNTER — Encounter: Payer: Self-pay | Admitting: General Practice

## 2020-01-24 ENCOUNTER — Emergency Department (HOSPITAL_COMMUNITY): Payer: Medicaid Other

## 2020-01-24 ENCOUNTER — Encounter (HOSPITAL_COMMUNITY): Payer: Self-pay

## 2020-01-24 ENCOUNTER — Emergency Department (HOSPITAL_COMMUNITY)
Admission: EM | Admit: 2020-01-24 | Discharge: 2020-01-24 | Disposition: A | Discharge status: Home / Self Care | Payer: Medicaid Other | Attending: Emergency Medicine | Admitting: Emergency Medicine

## 2020-01-24 ENCOUNTER — Ambulatory Visit (INDEPENDENT_AMBULATORY_CARE_PROVIDER_SITE_OTHER): Payer: Medicaid Other | Admitting: Nurse Practitioner

## 2020-01-24 ENCOUNTER — Encounter: Payer: Self-pay | Admitting: Nurse Practitioner

## 2020-01-24 VITALS — BP 99/74 | HR 89 | Temp 97.3°F | Ht 64.0 in | Wt 196.8 lb

## 2020-01-24 DIAGNOSIS — R112 Nausea with vomiting, unspecified: Secondary | ICD-10-CM | POA: Insufficient documentation

## 2020-01-24 DIAGNOSIS — F17210 Nicotine dependence, cigarettes, uncomplicated: Secondary | ICD-10-CM | POA: Insufficient documentation

## 2020-01-24 DIAGNOSIS — K5900 Constipation, unspecified: Secondary | ICD-10-CM

## 2020-01-24 DIAGNOSIS — K219 Gastro-esophageal reflux disease without esophagitis: Secondary | ICD-10-CM

## 2020-01-24 DIAGNOSIS — R1084 Generalized abdominal pain: Secondary | ICD-10-CM | POA: Diagnosis present

## 2020-01-24 DIAGNOSIS — N83201 Unspecified ovarian cyst, right side: Secondary | ICD-10-CM

## 2020-01-24 DIAGNOSIS — K920 Hematemesis: Secondary | ICD-10-CM

## 2020-01-24 DIAGNOSIS — R935 Abnormal findings on diagnostic imaging of other abdominal regions, including retroperitoneum: Secondary | ICD-10-CM

## 2020-01-24 DIAGNOSIS — J45909 Unspecified asthma, uncomplicated: Secondary | ICD-10-CM | POA: Insufficient documentation

## 2020-01-24 LAB — COMPREHENSIVE METABOLIC PANEL
ALT: 13 U/L (ref 0–44)
AST: 15 U/L (ref 15–41)
Albumin: 4.5 g/dL (ref 3.5–5.0)
Alkaline Phosphatase: 70 U/L (ref 38–126)
Anion gap: 11 (ref 5–15)
BUN: 10 mg/dL (ref 6–20)
CO2: 26 mmol/L (ref 22–32)
Calcium: 9.2 mg/dL (ref 8.9–10.3)
Chloride: 100 mmol/L (ref 98–111)
Creatinine, Ser: 0.63 mg/dL (ref 0.44–1.00)
GFR calc Af Amer: >60 mL/min (ref >60)
GFR calc non Af Amer: >60 mL/min (ref >60)
Glucose, Bld: 109 mg/dL — ABNORMAL HIGH (ref 70–99)
Potassium: 3.4 mmol/L — ABNORMAL LOW (ref 3.5–5.1)
Sodium: 137 mmol/L (ref 135–145)
Total Bilirubin: 0.5 mg/dL (ref 0.3–1.2)
Total Protein: 7.9 g/dL (ref 6.5–8.1)

## 2020-01-24 LAB — URINALYSIS, ROUTINE W REFLEX MICROSCOPIC
Bilirubin Urine: NEGATIVE
Glucose, UA: NEGATIVE mg/dL
Hgb urine dipstick: NEGATIVE
Ketones, ur: NEGATIVE mg/dL
Nitrite: NEGATIVE
Protein, ur: NEGATIVE mg/dL
Specific Gravity, Urine: 1.026 (ref 1.005–1.030)
pH: 8 (ref 5.0–8.0)

## 2020-01-24 LAB — CBC
HCT: 44.7 % (ref 36.0–46.0)
Hemoglobin: 14.7 g/dL (ref 12.0–15.0)
MCH: 31.3 pg (ref 26.0–34.0)
MCHC: 32.9 g/dL (ref 30.0–36.0)
MCV: 95.1 fL (ref 80.0–100.0)
Platelets: 257 10*3/uL (ref 150–400)
RBC: 4.7 MIL/uL (ref 3.87–5.11)
RDW: 13 % (ref 11.5–15.5)
WBC: 8 10*3/uL (ref 4.0–10.5)
nRBC: 0 % (ref 0.0–0.2)

## 2020-01-24 LAB — LIPASE, BLOOD: Lipase: 25 U/L (ref 11–51)

## 2020-01-24 LAB — PREGNANCY, URINE: Preg Test, Ur: NEGATIVE

## 2020-01-24 MED ORDER — IOHEXOL 300 MG/ML  SOLN
100.0000 mL | Freq: Once | INTRAMUSCULAR | Status: AC | PRN
  Administered 2020-01-24: 100 mL via INTRAVENOUS

## 2020-01-24 MED ORDER — ONDANSETRON HCL 4 MG PO TABS: 4.0000 mg | ORAL_TABLET | Freq: Three times a day (TID) | ORAL | 1 refills | Status: DC | PRN

## 2020-01-24 MED ORDER — DICYCLOMINE HCL 10 MG PO CAPS: 10.0000 mg | ORAL_CAPSULE | ORAL | 0 refills | Status: DC | PRN

## 2020-01-24 MED ORDER — SODIUM CHLORIDE 0.9 % IV BOLUS
1000.0000 mL | Freq: Once | INTRAVENOUS | Status: AC
  Administered 2020-01-24: 1000 mL via INTRAVENOUS

## 2020-01-24 MED ORDER — PANTOPRAZOLE SODIUM 40 MG PO TBEC: 40.0000 mg | DELAYED_RELEASE_TABLET | Freq: Two times a day (BID) | ORAL | 5 refills | Status: DC

## 2020-01-24 MED ORDER — ONDANSETRON HCL 4 MG/2ML IJ SOLN
4.0000 mg | Freq: Once | INTRAMUSCULAR | Status: AC
  Administered 2020-01-24: 4 mg via INTRAVENOUS
  Filled 2020-01-24: qty 2

## 2020-01-24 MED ORDER — MORPHINE SULFATE (PF) 4 MG/ML IV SOLN
4.0000 mg | Freq: Once | INTRAVENOUS | Status: AC
  Administered 2020-01-24: 4 mg via INTRAVENOUS
  Filled 2020-01-24: qty 1

## 2020-01-24 NOTE — Assessment & Plan Note (Signed)
She does have epigastric tenderness/pain as well as nausea and vomiting with a known history of GERD on Protonix 40 mg daily.  I will increase her Protonix to twice daily to see if we can squeeze any better effect.  She is on the schedule for an EGD in about 2 weeks which allow further evaluation as well.

## 2020-01-24 NOTE — Telephone Encounter (Signed)
Patient called in stating she is having abd pain and throwing up.  I offered her several appt options for today, however she stated she doesn't know if she can come in today, because she has to work.  I also offered her appts for Thursday of this week and she stated she will call me back to schedule an appt.

## 2020-01-24 NOTE — Assessment & Plan Note (Addendum)
Worsening abdominal pain today.  We previously saw her for abdominal pain with noted abnormal CT including colon wall thickening suspicious for infectious versus inflammatory colitis.  In the office today she appears to be in significantly more discomfort.  She states her pain is constant at a level of 3, but worsens as bad as 10 out of 10 with standing, sitting, bowel movements, any of a number of activities or events.  Given her recent colon wall thickening documented on CT 2 months ago I am concerned if she is having worsening edema she could be partially obstructed.  Her bowel sounds do seem somewhat hypoactive today.  Given her severe/worsening pain I recommended she proceed the emergency department for further evaluation including likely need for labs and repeat CT.  Follow-up in 6 to 8 weeks.

## 2020-01-24 NOTE — H&P (View-Only) (Signed)
Referring Provider: Kaleen Mask, * Primary Care Physician:  Kaleen Mask, MD Primary GI:  Dr. Jena Gauss  Chief Complaint  Patient presents with  . Abdominal Pain    x 2 weeks constant pain, varies from upper-lower on both sides, abd swelling with pain when more intense. Went to UC saturday and was rx'd bentyl  . Nausea    daily x 2 weeks  . Emesis    QOD    HPI:   Samantha Russell is a 29 y.o. female who presents for follow-up on abdominal pain and constipation.  The patient was last seen in our office 11/22/2019 for constipation, hematemesis, abnormal CT of the abdomen, generalized abdominal pain.  History of positive hepatitis C.  History of chronic constipation with trial and failure of Linzess due to diarrhea even at low-dose.  Off medication has a bowel movement every 4 to 5 days, history of GERD on Protonix daily.  Previous EGD in [place], [place] with erosions and started on Nexium.  NSAIDs intermittently historically.  Of note, her positive hepatitis C antibody had unclear RNA testing and felt likely hepatitis C with spontaneous clearance.  Previously did do well on Amitiza 24 mcg twice daily.  Follow-up hepatitis C RNA was negative.  CT of the abdomen and pelvis completed in December 2020 found negative study with no acute findings and specifically the stomach and bowel without evidence of obstruction, inflammatory process, or abnormal fluid collections.  ER visit 11/18/2019 with abdominal pain that was progressively worsening with nonbloody nonbilious vomiting and clay colored stools with dark tarry stools a couple days prior.  Had not been taking her medication for her stomach ulcers.  Labs essentially unremarkable.  CT found suggestion of wall thickening involving the splenic flexure of the colon and ascending colon which may be secondary to underdistention versus infectious or inflammatory colitis.  This is new compared to her previous CT.  Felt possible  inflammatory bowel disease with her history of dark stools and chronic abdominal pain and started on antibiotics and mesalamine and referred to GI.  At her last visit she noted her pain was better.  History of peptic ulcer disease and still some lower abdominal pain and epigastric pain, seeing urology.  Epigastric pain worse with eating and described as crampy and sharp.  Lower abdominal pain does not improve with a bowel movement.  Still on NSAIDs 2-3 times a week with only 1 or 2 doses a day.  Persistent nausea and occasional vomiting with hematemesis x1 episode after few hours of persistent vomiting.  Seen urology soon to add nitrates another UA abnormalities.  Has not started Pentasa but is taking antibiotics as ordered but not completed.  Amitiza working well but a little less effective.  Recommended colonoscopy, Zofran, continue Amitiza, complete antibiotics, follow-up in 6 months.  Colonoscopy was scheduled for 02/13/2020.   She called our office 01/17/2020 with notation that she went to urgent care for abdominal pain and recommended to follow-up with GI.  Taking Amitiza as directed, pain constant but severity waxes and wanes.  Bowel movements collect clay and her abdomen hurts.  Feels "warm all over" but cannot find thermometer.  She does note her daughter had recently had vomiting and urgent care indicated viral infection circulating and query viral gastroenteritis.  The patient felt overall she was more having a viral infection recommended clear liquids for 24 to 48 hours and advance to brat diet and then further advance as tolerated, plenty of fluids,  call if no improvement in 48 to 72 hours, proceed to ER for severe symptoms.  Follow-up with the patient by [facility] indicates chronic nausea and intermittent abdominal pain that waxes and wanes but doing somewhat better, Zofran helps with nausea and bowels are doing okay without fever.  The patient preferred to continue to monitor for the next 1 to 2 days  and recommended to call us if no improvement.  Office visit for that day (01/18/2020) was canceled.  The patient again called our office today saying she is having abdominal pain vomiting and she was offered an office visit.  Indicated she would call back to schedule.  She was eventually placed on the appointment scheduled for today.  Today she states she's not doing very well. Still having abdominal pain. Pain is constant about 3 out of 10, but worsens if she stands, sits, walks; sometimes worsens while defecating or after defecating. Pain gets "really bad, to where I'm crying" about a 10 out of 10. Having 1-2 bowel movements daily but a little harder and with straining. Also with belching, N/V (last episode of emesis was this morning; generally vomiting every other day. Saw urgent care who gave her Bentyl which has not helped at all. Pain is upper to lower abdomen, pain description varies from burning to stabbing. Burning is worse if she has a full bladder. Denies hematochezia, melena (but dark brown). Denies objective fever but feels "burning up" at times. Denies unintentional weight loss. Stools are variable in consistency even in the same bowel movement (hard, bumpy, soft, etc). Denies URI or flu-like symptoms. Denies loss of sense of taste or smell. Denies chest pain, dyspnea, dizziness, lightheadedness, syncope, near syncope. Denies any other upper or lower GI symptoms.  Has chronic GERD which is somewhat worse than normal. She feels the only thing that helps her is "not eating."   No longer taking any NSAIDs, no ASA powders.  Past Medical History:  Diagnosis Date  . Asthma   . Bipolar 1 disorder (HCC)   . Multiple gastric ulcers    in [place], [place]  . Stomach ulcer     Past Surgical History:  Procedure Laterality Date  . DILATION AND CURETTAGE OF UTERUS    . ESOPHAGOGASTRODUODENOSCOPY  2013   [place], [place]    Current Outpatient Medications  Medication Sig Dispense Refill  . albuterol  (VENTOLIN HFA) 108 (90 Base) MCG/ACT inhaler Inhale 2 puffs into the lungs every 6 (six) hours as needed for wheezing or shortness of breath. 1 Inhaler 1  . AMITIZA 24 MCG capsule TAKE 1 CAPSULE (24 MCG TOTAL) BY MOUTH 2 (TWO) TIMES DAILY WITH A MEAL. 60 capsule 3  . dicyclomine (BENTYL) 10 MG capsule Take 10 mg by mouth as needed for spasms.    . diphenhydrAMINE (BENADRYL) 25 MG tablet Take 25 mg by mouth every 6 (six) hours as needed.    . fluticasone (FLONASE) 50 MCG/ACT nasal spray Take 1-2 sprays daily (Patient taking differently: Place 4 sprays into both nostrils daily. ) 16 g 5  . fluticasone (FLOVENT HFA) 110 MCG/ACT inhaler Inhale 2 puffs into the lungs 2 (two) times a day. (Patient taking differently: Inhale 4 puffs into the lungs 2 (two) times a day. ) 1 Inhaler 5  . lamoTRIgine (LAMICTAL) 100 MG tablet Take 200 mg by mouth daily.    . ondansetron (ZOFRAN) 4 MG tablet Take 1 tablet (4 mg total) by mouth every 8 (eight) hours as needed for nausea or vomiting.  30 tablet 1  . pantoprazole (PROTONIX) 40 MG tablet 2 (two) times daily.     . sertraline (ZOLOFT) 25 MG tablet Take 100 mg by mouth daily.     . Simethicone (GAS RELIEF PO) Take by mouth as needed.    . mesalamine (PENTASA) 500 MG CR capsule Take 1 capsule (500 mg total) by mouth 4 (four) times daily for 7 days. (Patient not taking: Reported on 11/22/2019) 28 capsule 0   No current facility-administered medications for this visit.    Allergies as of 01/24/2020 - Review Complete 01/24/2020  Allergen Reaction Noted  . Penicillins Other (See Comments) 06/24/2015    Family History  Problem Relation Age of Onset  . Anxiety disorder Mother   . Depression Mother   . Heart disease Father   . Diabetes Maternal Grandmother   . Diabetes Paternal Grandmother   . Colon cancer Neg Hx     Social History   Socioeconomic History  . Marital status: Married    Spouse name: Not on file  . Number of children: Not on file  . Years of  education: Not on file  . Highest education level: Not on file  Occupational History  . Not on file  Tobacco Use  . Smoking status: Current Every Day Smoker    Packs/day: 0.50    Years: 10.00    Pack years: 5.00    Types: Cigarettes  . Smokeless tobacco: Never Used  Substance and Sexual Activity  . Alcohol use: Yes    Comment: rare  . Drug use: No  . Sexual activity: Yes    Birth control/protection: None  Other Topics Concern  . Not on file  Social History Narrative  . Not on file   Social Determinants of Health   Financial Resource Strain:   . Difficulty of Paying Living Expenses:   Food Insecurity:   . Worried About Charity fundraiser in the Last Year:   . Arboriculturist in the Last Year:   Transportation Needs:   . Film/video editor (Medical):   Marland Kitchen Lack of Transportation (Non-Medical):   Physical Activity:   . Days of Exercise per Week:   . Minutes of Exercise per Session:   Stress:   . Feeling of Stress :   Social Connections:   . Frequency of Communication with Friends and Family:   . Frequency of Social Gatherings with Friends and Family:   . Attends Religious Services:   . Active Member of Clubs or Organizations:   . Attends Archivist Meetings:   Marland Kitchen Marital Status:     Subjective: Review of Systems  Constitutional: Positive for fever (subjective). Negative for chills, malaise/fatigue and weight loss.  HENT: Negative for congestion and sore throat.   Respiratory: Negative for cough and shortness of breath.   Cardiovascular: Negative for chest pain and palpitations.  Gastrointestinal: Positive for abdominal pain, constipation, nausea and vomiting (last emesis this morning). Negative for blood in stool, diarrhea and melena.  Musculoskeletal: Negative for joint pain and myalgias.  Skin: Negative for rash.  Neurological: Negative for dizziness and weakness.  Endo/Heme/Allergies: Does not bruise/bleed easily.  Psychiatric/Behavioral: Negative  for depression. The patient is not nervous/anxious.   All other systems reviewed and are negative.    Objective: BP 99/74   Pulse 89   Temp (!) 97.3 F (36.3 C) (Oral)   Ht 5\' 4"  (1.626 m)   Wt 196 lb 12.8 oz (89.3 kg)   BMI  33.78 kg/m  Physical Exam Vitals and nursing note reviewed.  Constitutional:      General: She is not in acute distress.    Appearance: Normal appearance. She is well-developed. She is obese. She is not ill-appearing, toxic-appearing or diaphoretic.  HENT:     Head: Normocephalic and atraumatic.     Nose: No congestion or rhinorrhea.  Eyes:     General: No scleral icterus. Cardiovascular:     Rate and Rhythm: Normal rate and regular rhythm.     Heart sounds: Normal heart sounds.  Pulmonary:     Effort: Pulmonary effort is normal. No respiratory distress.     Breath sounds: Normal breath sounds.  Abdominal:     General: Bowel sounds are decreased.     Palpations: Abdomen is soft. There is no hepatomegaly, splenomegaly or mass.     Tenderness: There is abdominal tenderness in the right upper quadrant, right lower quadrant, epigastric area and periumbilical area. There is no guarding or rebound.     Hernia: No hernia is present.  Skin:    General: Skin is warm and dry.     Coloration: Skin is not jaundiced.     Findings: No rash.  Neurological:     General: No focal deficit present.     Mental Status: She is alert and oriented to person, place, and time.  Psychiatric:        Attention and Perception: Attention normal.        Mood and Affect: Mood normal.        Speech: Speech normal.        Behavior: Behavior normal.        Thought Content: Thought content normal.        Cognition and Memory: Cognition and memory normal.       01/24/2020 4:04 PM   Disclaimer: This note was dictated with voice recognition software. Similar sounding words can inadvertently be transcribed and may not be corrected upon review.

## 2020-01-24 NOTE — Patient Instructions (Signed)
Your health issues we discussed today were:   GERD with nausea/vomiting: 1. Increase your Protonix to twice daily.  Take this for sing in the morning and 30 minutes for your last meal the day 2. I sent a new prescription to your pharmacy for the increased dosing 3. Call us if having worsening GERD symptoms 4. Keep your appointment for your upper endoscopy in 2 weeks.  Constipation: 1. As we discussed, because it is not helping stop taking dicyclomine (Bentyl) 2. Continue taking Amitiza 24 mcg twice daily for your constipation 3. Call us if you have any worsening or severe symptoms.  Abdominal pain with nausea and vomiting: 1. Given your recent abnormal CT about 2 months ago and your worsening abdominal pain I am recommending you go to the emergency department for further evaluation 2. I will call the charge nurse in the emergency department and let them know you are on your way  Overall I recommend:  1. Continue other current medications 2. Return for follow-up in 6 to 8 weeks 3. Call us if you have any questions or concerns   ---------------------------------------------------------------  COVID-19 Vaccine Information can be found at: [email or web address] For questions related to vaccine distribution or appointments, please email [email or web address] or call [phone].   ---------------------------------------------------------------   At [facility] we value your feedback. You may receive a survey about your visit today. Please share your experience as we strive to create trusting relationships with our patients to provide genuine, compassionate, quality care.  We appreciate your understanding and patience as we review any laboratory studies, imaging, and other diagnostic tests that are ordered as we care for you. Our office policy is 5 business days for review of these results, and any emergent or urgent  results are addressed in a timely manner for your best interest. If you do not hear from our office in 1 week, please contact us.   We also encourage the use of MyChart, which contains your medical information for your review as well. If you are not enrolled in this feature, an access code is on this after visit summary for your convenience. Thank you for allowing [place] to be involved in your care.  It was great to see you today!  I hope you have a great Summer!!

## 2020-01-24 NOTE — Discharge Instructions (Addendum)
Use Bentyl and Zofran as needed for pain and nausea.  Please begin taking a daily fiber supplement, these can be purchased over-the-counter, the fiber you purchase should be psyllium husk.  I would also like for you to start taking a daily probiotic supplement, these are also available over-the-counter.  Make sure you are drinking plenty of water while taking these medications.  Please follow-up with your GI doctor for continued evaluation.  Your scan today showed a right ovarian cyst, this may be contributing to pain, but I think this is more likely due to irritable bowel syndrome.  Please follow-up with your OB/GYN regarding cyst.

## 2020-01-24 NOTE — Assessment & Plan Note (Signed)
The patient describes constant, daily nausea which is somewhat improved with antiemetics.  Vomiting about every other day, last episode of emesis this morning.  Denies hematemesis.  This is all in the setting of chronic GERD.  Further management for GERD below which may improve her nausea/vomiting.

## 2020-01-24 NOTE — Assessment & Plan Note (Signed)
Chronic constipation generally was doing well on Amitiza 24 mcg twice daily.  She was seen in urgent care for abdominal pain and I gave her Bentyl, currently having somewhat harder stools with minimal straining.  This could be related to constipation worsened by Bentyl.  I recommend she stop taking this as it does not seem to be helping her symptoms and is likely exacerbating her constipation.  Continue Amitiza.

## 2020-01-24 NOTE — ED Provider Notes (Signed)
[facility] EMERGENCY DEPARTMENT Provider Note   CSN: [id number] Arrival date & time: 01/24/20  1644     History Chief Complaint  Patient presents with  . Abdominal Pain    Samantha Russell is a 29 y.o. female.  Samantha Russell is a 29 y.o. female with a history of asthma, bipolar disorder, gastric ulcers, GERD, abdominal pain, who presents to the ED for evaluation of generalized abdominal discomfort.  She was recently seen in the ED and had a CT done that showed possible colitis.  She was seen at the GI office today, but due to her worsening discomfort they recommended she return to the emergency department for repeat imaging with concern that she could have worsening colitis.  Patient also states that she has been googling her symptoms and is concerned it could be her gallbladder.  Patient states that she does sometimes have pain in the right upper quadrant but also frequently has lower abdominal pain.  She has been dealing with this pain with waxing and waning episodes for 6 years, has no known history of gallstones or gallbladder disease.  She reports a few episodes of nausea and vomiting, and that her bowels fluctuate between constipation and diarrhea, today she has been having some diarrhea, denies any blood in her stools.  No fevers or chills.  No urinary symptoms.  No vaginal bleeding or vaginal discharge.  Denies pelvic pain.  She has an upcoming EGD and colonoscopy scheduled with GI on May 3 and is followed by Dr. Jena Gauss.        Past Medical History:  Diagnosis Date  . Asthma   . Bipolar 1 disorder (HCC)   . Multiple gastric ulcers    in [place], [place]  . Stomach ulcer     Patient Active Problem List   Diagnosis Date Noted  . Nausea with vomiting 01/24/2020  . Hematemesis 11/22/2019  . Abnormal CT of the abdomen 11/22/2019  . Abdominal pain 09/27/2019  . Bloating 06/28/2019  . History of positive hepatitis C 04/27/2019  . Constipation 04/27/2019  . GERD  (gastroesophageal reflux disease) 04/27/2019  . Other forms of dyspnea 02/22/2019  . Perennial and seasonal allergic rhinitis 02/17/2019  . Penicillin allergy 02/17/2019  . Tobacco user 02/17/2019  . Mild persistent asthma 02/17/2019  . Rash and other nonspecific skin eruption 02/17/2019    Past Surgical History:  Procedure Laterality Date  . DILATION AND CURETTAGE OF UTERUS    . ESOPHAGOGASTRODUODENOSCOPY  2013   [place], [place]     OB History    Gravida  4   Para  2   Term  2   Preterm      AB  1   Living  2     SAB  1   TAB      Ectopic      Multiple      Live Births              Family History  Problem Relation Age of Onset  . Anxiety disorder Mother   . Depression Mother   . Heart disease Father   . Diabetes Maternal Grandmother   . Diabetes Paternal Grandmother   . Colon cancer Neg Hx     Social History   Tobacco Use  . Smoking status: Current Every Day Smoker    Packs/day: 0.50    Years: 10.00    Pack years: 5.00    Types: Cigarettes  . Smokeless tobacco: Never Used  Substance Use Topics  .  Alcohol use: Yes    Comment: rare  . Drug use: No    Home Medications Prior to Admission medications   Medication Sig Start Date End Date Taking? Authorizing Provider  albuterol (VENTOLIN HFA) 108 (90 Base) MCG/ACT inhaler Inhale 2 puffs into the lungs every 6 (six) hours as needed for wheezing or shortness of breath. 02/17/19   Garnet Sierras, DO  AMITIZA 24 MCG capsule TAKE 1 CAPSULE (24 MCG TOTAL) BY MOUTH 2 (TWO) TIMES DAILY WITH A MEAL. 09/27/19   Annitta Needs, NP  dicyclomine (BENTYL) 10 MG capsule Take 10 mg by mouth as needed for spasms.    [provider]  diphenhydrAMINE (BENADRYL) 25 MG tablet Take 25 mg by mouth every 6 (six) hours as needed.    [provider]  fluticasone (FLONASE) 50 MCG/ACT nasal spray Take 1-2 sprays daily Patient taking differently: Place 4 sprays into both nostrils daily.  02/22/19   Bobbitt, Sedalia Muta, MD  fluticasone (FLOVENT HFA) 110 MCG/ACT inhaler Inhale 2 puffs into the lungs 2 (two) times a day. Patient taking differently: Inhale 4 puffs into the lungs 2 (two) times a day.  02/22/19   Bobbitt, Sedalia Muta, MD  lamoTRIgine (LAMICTAL) 100 MG tablet Take 200 mg by mouth daily.    [provider]  mesalamine (PENTASA) 500 MG CR capsule Take 1 capsule (500 mg total) by mouth 4 (four) times daily for 7 days. Patient not taking: Reported on 11/22/2019 11/18/19 11/25/19  Mesner, Corene Cornea, MD  ondansetron (ZOFRAN) 4 MG tablet Take 1 tablet (4 mg total) by mouth every 8 (eight) hours as needed for nausea or vomiting. 11/22/19   Carlis Stable, NP  pantoprazole (PROTONIX) 40 MG tablet Take 1 tablet (40 mg total) by mouth 2 (two) times daily before a meal. 01/24/20   Carlis Stable, NP  sertraline (ZOLOFT) 25 MG tablet Take 100 mg by mouth daily.     [provider]  Simethicone (GAS RELIEF PO) Take by mouth as needed.    [provider]    Allergies    Penicillins  Review of Systems   Review of Systems  Constitutional: Negative for chills and fever.  HENT: Negative.   Respiratory: Negative for cough and shortness of breath.   Cardiovascular: Negative for chest pain.  Gastrointestinal: Positive for abdominal pain, constipation, diarrhea, nausea and vomiting. Negative for blood in stool.  Genitourinary: Negative for dysuria and frequency.  Musculoskeletal: Negative for arthralgias and myalgias.  Skin: Negative for color change and rash.  Neurological: Negative for dizziness, syncope and light-headedness.    Physical Exam Updated Vital Signs BP 107/90 (BP Location: Right Arm)   Pulse 87   Temp 98.3 F (36.8 C) (Oral)   Resp 18   Ht 5\' 4"  (1.626 m)   Wt 88.9 kg   LMP 12/24/2019   SpO2 97%   BMI 33.64 kg/m   Physical Exam Vitals and nursing note reviewed.  Constitutional:      General: She is not in acute distress.    Appearance: She is well-developed and  normal weight. She is not ill-appearing or diaphoretic.  HENT:     Head: Normocephalic and atraumatic.     Mouth/Throat:     Mouth: Mucous membranes are moist.     Pharynx: Oropharynx is clear.  Eyes:     General:        Right eye: No discharge.        Left eye: No discharge.  Cardiovascular:     Rate and Rhythm: Normal rate and regular rhythm.     Heart sounds: Normal heart sounds. No murmur. No friction rub. No gallop.   Pulmonary:     Effort: Pulmonary effort is normal. No respiratory distress.     Breath sounds: Normal breath sounds. No wheezing or rales.     Comments: Respirations equal and unlabored, patient able to speak in full sentences, lungs clear to auscultation bilaterally Abdominal:     General: Bowel sounds are normal. There is no distension.     Palpations: Abdomen is soft. There is no mass.     Tenderness: There is generalized abdominal tenderness. There is no guarding.     Comments: Abdomen is soft, nondistended, bowel sounds present throughout, abdomen with generalized tenderness, she does have tenderness in the right upper quadrant but it does not localize to this area and she is also tender across the lower abdomen.  No CVA tenderness bilaterally  Musculoskeletal:        General: No deformity.     Cervical back: Neck supple.  Skin:    General: Skin is warm and dry.     Capillary Refill: Capillary refill takes less than 2 seconds.  Neurological:     Mental Status: She is alert.     Coordination: Coordination normal.     Comments: Speech is clear, able to follow commands Moves extremities without ataxia, coordination intact  Psychiatric:        Behavior: Behavior normal.     ED Results / Procedures / Treatments   Labs (all labs ordered are listed, but only abnormal results are displayed) Labs Reviewed  COMPREHENSIVE METABOLIC PANEL - Abnormal; Notable for the following components:      Result Value   Potassium 3.4 (*)    Glucose, Bld 109 (*)    All  other components within normal limits  URINALYSIS, ROUTINE W REFLEX MICROSCOPIC - Abnormal; Notable for the following components:   APPearance HAZY (*)    Leukocytes,Ua TRACE (*)    Bacteria, UA RARE (*)    All other components within normal limits  LIPASE, BLOOD  CBC  PREGNANCY, URINE    EKG None  Radiology CT Abdomen Pelvis W Contrast  Result Date: 01/24/2020 CLINICAL DATA:  Right upper quadrant pain. EXAM: CT ABDOMEN AND PELVIS WITH CONTRAST TECHNIQUE: Multidetector CT imaging of the abdomen and pelvis was performed using the standard protocol following bolus administration of intravenous contrast. CONTRAST:  OMNIPAQUE IOHEXOL 300 MG/ML  SOLN COMPARISON:  November 18, 2019 FINDINGS: Lower chest: No acute abnormality. Hepatobiliary: No focal liver abnormality is seen. No gallstones, gallbladder wall thickening, or biliary dilatation. Pancreas: Unremarkable. No pancreatic ductal dilatation or surrounding inflammatory changes. Spleen: Normal in size without focal abnormality. Adrenals/Urinary Tract: Adrenal glands are unremarkable. Kidneys are normal, without renal calculi, focal lesion, or hydronephrosis. Bladder is unremarkable. Stomach/Bowel: Stomach is within normal limits. Appendix appears normal. No evidence of bowel wall thickening, distention, or inflammatory changes. Vascular/Lymphatic: No significant vascular findings are present. No enlarged abdominal or pelvic lymph nodes. Reproductive: Uterus is normal in appearance. 1.9 cm x 1.6 cm and 2.5 cm x 2.2 cm right adnexal cysts are noted. Other: No abdominal wall hernia or abnormality. No abdominopelvic ascites. Musculoskeletal: No acute or significant osseous findings. IMPRESSION: Small right adnexal cysts, likely ovarian in origin. Electronically Signed   By: Aram Candela M.D.   On: 01/24/2020 21:34     Procedures Procedures (including critical care time)  Medications Ordered in ED Medications  sodium chloride 0.9 % bolus  1,000 mL (0 mLs Intravenous Stopped 01/24/20 2210)  ondansetron (ZOFRAN) injection 4 mg (4 mg Intravenous Given 01/24/20 2052)  morphine 4 MG/ML injection 4 mg (4 mg Intravenous Given 01/24/20 2052)  iohexol (OMNIPAQUE) 300 MG/ML solution 100 mL (100 mLs Intravenous Contrast Given 01/24/20 2111)    ED Course  I have reviewed the triage vital signs and the nursing notes.  Pertinent labs & imaging results that were available during my care of the patient were reviewed by me and considered in my medical decision making (see chart for details).    MDM Rules/Calculators/A&P                      Patient presents to the ED with complaints of abdominal pain. Patient nontoxic appearing, in no apparent distress, vitals WNL. On exam patient tender to palpation throughout the abdomen, no peritoneal signs. Will evaluate with labs and CT. Analgesics, anti-emetics, and fluids administered.   Differential diagnosis includes: Worsening colitis, IBS, IBD, gastritis, cholecystitis, appendicitis, diverticulitis, GERD, peptic ulcer disease, etc.  I Ordered, reviewed, and interpreted labs, which included:  CBC: No leukocytosis, normal hemoglobin CMP: Potassium of 3.4, glucose of 109, no other electrolyte derangements, normal renal and liver function Lipase: WNL UA: Hazy with trace leukocytes, rare bacteria, squamous cells present, suspect contamination, patient without urinary symptoms Preg test: Negative   I ordered imaging studies which included CT abdomen pelvis with contrast, I independently visualized and interpreted imaging which showed small right adnexal cyst, likely ovarian in origin, these are relatively small in size and I have low suspicion for ovarian torsion, patient does not have well localized right-sided pain to suggest this etiology.  No other acute abnormalities noted on CT, previous colitis has resolved.  No evidence of gallbladder disease.  On repeat abdominal exam patient remains without  peritoneal signs, doubt cholecystitis, pancreatitis, diverticulitis, appendicitis, bowel obstruction/perforation,  PID, or ectopic pregnancy. Patient tolerating PO in the emergency department. Will discharge home with supportive measures, encourage patient to begin using fiber supplement, probiotic, Bentyl prescribed for pain. I discussed results, treatment plan, need for PCP/GI follow-up, and return precautions with the patient. Provided opportunity for questions, patient confirmed understanding and is in agreement with plan.   Portions of this note were generated with Scientist, clinical (histocompatibility and immunogenetics). Dictation errors may occur despite best attempts at proofreading.  Final Clinical Impression(s) / ED Diagnoses Final diagnoses:  Generalized abdominal pain  Right ovarian cyst    Rx / DC Orders ED Discharge Orders         Ordered    dicyclomine (BENTYL) 10 MG capsule  As needed     01/24/20 2330    ondansetron (ZOFRAN) 4 MG tablet  Every 8 hours PRN     01/24/20 2330           Dartha Lodge, PA-C 01/27/20 1149    Bethann Berkshire, MD 01/30/20 1507

## 2020-01-24 NOTE — Progress Notes (Signed)
Referring Provider: Kaleen Mask, * Primary Care Physician:  Kaleen Mask, MD Primary GI:  Dr. Jena Gauss  Chief Complaint  Patient presents with  . Abdominal Pain    x 2 weeks constant pain, varies from upper-lower on both sides, abd swelling with pain when more intense. Went to UC saturday and was rx'd bentyl  . Nausea    daily x 2 weeks  . Emesis    QOD    HPI:   Samantha Russell is a 29 y.o. female who presents for follow-up on abdominal pain and constipation.  The patient was last seen in our office 11/22/2019 for constipation, hematemesis, abnormal CT of the abdomen, generalized abdominal pain.  History of positive hepatitis C.  History of chronic constipation with trial and failure of Linzess due to diarrhea even at low-dose.  Off medication has a bowel movement every 4 to 5 days, history of GERD on Protonix daily.  Previous EGD in [place], [place] with erosions and started on Nexium.  NSAIDs intermittently historically.  Of note, her positive hepatitis C antibody had unclear RNA testing and felt likely hepatitis C with spontaneous clearance.  Previously did do well on Amitiza 24 mcg twice daily.  Follow-up hepatitis C RNA was negative.  CT of the abdomen and pelvis completed in December 2020 found negative study with no acute findings and specifically the stomach and bowel without evidence of obstruction, inflammatory process, or abnormal fluid collections.  ER visit 11/18/2019 with abdominal pain that was progressively worsening with nonbloody nonbilious vomiting and clay colored stools with dark tarry stools a couple days prior.  Had not been taking her medication for her stomach ulcers.  Labs essentially unremarkable.  CT found suggestion of wall thickening involving the splenic flexure of the colon and ascending colon which may be secondary to underdistention versus infectious or inflammatory colitis.  This is new compared to her previous CT.  Felt possible  inflammatory bowel disease with her history of dark stools and chronic abdominal pain and started on antibiotics and mesalamine and referred to GI.  At her last visit she noted her pain was better.  History of peptic ulcer disease and still some lower abdominal pain and epigastric pain, seeing urology.  Epigastric pain worse with eating and described as crampy and sharp.  Lower abdominal pain does not improve with a bowel movement.  Still on NSAIDs 2-3 times a week with only 1 or 2 doses a day.  Persistent nausea and occasional vomiting with hematemesis x1 episode after few hours of persistent vomiting.  Seen urology soon to add nitrates another UA abnormalities.  Has not started Pentasa but is taking antibiotics as ordered but not completed.  Amitiza working well but a little less effective.  Recommended colonoscopy, Zofran, continue Amitiza, complete antibiotics, follow-up in 6 months.  Colonoscopy was scheduled for 02/13/2020.   She called our office 01/17/2020 with notation that she went to urgent care for abdominal pain and recommended to follow-up with GI.  Taking Amitiza as directed, pain constant but severity waxes and wanes.  Bowel movements collect clay and her abdomen hurts.  Feels "warm all over" but cannot find thermometer.  She does note her daughter had recently had vomiting and urgent care indicated viral infection circulating and query viral gastroenteritis.  The patient felt overall she was more having a viral infection recommended clear liquids for 24 to 48 hours and advance to brat diet and then further advance as tolerated, plenty of fluids,  call if no improvement in 48 to 72 hours, proceed to ER for severe symptoms.  Follow-up with the patient by [facility] indicates chronic nausea and intermittent abdominal pain that waxes and wanes but doing somewhat better, Zofran helps with nausea and bowels are doing okay without fever.  The patient preferred to continue to monitor for the next 1 to 2 days  and recommended to call us if no improvement.  Office visit for that day (01/18/2020) was canceled.  The patient again called our office today saying she is having abdominal pain vomiting and she was offered an office visit.  Indicated she would call back to schedule.  She was eventually placed on the appointment scheduled for today.  Today she states she's not doing very well. Still having abdominal pain. Pain is constant about 3 out of 10, but worsens if she stands, sits, walks; sometimes worsens while defecating or after defecating. Pain gets "really bad, to where I'm crying" about a 10 out of 10. Having 1-2 bowel movements daily but a little harder and with straining. Also with belching, N/V (last episode of emesis was this morning; generally vomiting every other day. Saw urgent care who gave her Bentyl which has not helped at all. Pain is upper to lower abdomen, pain description varies from burning to stabbing. Burning is worse if she has a full bladder. Denies hematochezia, melena (but dark brown). Denies objective fever but feels "burning up" at times. Denies unintentional weight loss. Stools are variable in consistency even in the same bowel movement (hard, bumpy, soft, etc). Denies URI or flu-like symptoms. Denies loss of sense of taste or smell. Denies chest pain, dyspnea, dizziness, lightheadedness, syncope, near syncope. Denies any other upper or lower GI symptoms.  Has chronic GERD which is somewhat worse than normal. She feels the only thing that helps her is "not eating."   No longer taking any NSAIDs, no ASA powders.  Past Medical History:  Diagnosis Date  . Asthma   . Bipolar 1 disorder (HCC)   . Multiple gastric ulcers    in [place], [place]  . Stomach ulcer     Past Surgical History:  Procedure Laterality Date  . DILATION AND CURETTAGE OF UTERUS    . ESOPHAGOGASTRODUODENOSCOPY  2013   [place], [place]    Current Outpatient Medications  Medication Sig Dispense Refill  . albuterol  (VENTOLIN HFA) 108 (90 Base) MCG/ACT inhaler Inhale 2 puffs into the lungs every 6 (six) hours as needed for wheezing or shortness of breath. 1 Inhaler 1  . AMITIZA 24 MCG capsule TAKE 1 CAPSULE (24 MCG TOTAL) BY MOUTH 2 (TWO) TIMES DAILY WITH A MEAL. 60 capsule 3  . dicyclomine (BENTYL) 10 MG capsule Take 10 mg by mouth as needed for spasms.    . diphenhydrAMINE (BENADRYL) 25 MG tablet Take 25 mg by mouth every 6 (six) hours as needed.    . fluticasone (FLONASE) 50 MCG/ACT nasal spray Take 1-2 sprays daily (Patient taking differently: Place 4 sprays into both nostrils daily. ) 16 g 5  . fluticasone (FLOVENT HFA) 110 MCG/ACT inhaler Inhale 2 puffs into the lungs 2 (two) times a day. (Patient taking differently: Inhale 4 puffs into the lungs 2 (two) times a day. ) 1 Inhaler 5  . lamoTRIgine (LAMICTAL) 100 MG tablet Take 200 mg by mouth daily.    . ondansetron (ZOFRAN) 4 MG tablet Take 1 tablet (4 mg total) by mouth every 8 (eight) hours as needed for nausea or vomiting.  30 tablet 1  . pantoprazole (PROTONIX) 40 MG tablet 2 (two) times daily.     . sertraline (ZOLOFT) 25 MG tablet Take 100 mg by mouth daily.     . Simethicone (GAS RELIEF PO) Take by mouth as needed.    . mesalamine (PENTASA) 500 MG CR capsule Take 1 capsule (500 mg total) by mouth 4 (four) times daily for 7 days. (Patient not taking: Reported on 11/22/2019) 28 capsule 0   No current facility-administered medications for this visit.    Allergies as of 01/24/2020 - Review Complete 01/24/2020  Allergen Reaction Noted  . Penicillins Other (See Comments) 06/24/2015    Family History  Problem Relation Age of Onset  . Anxiety disorder Mother   . Depression Mother   . Heart disease Father   . Diabetes Maternal Grandmother   . Diabetes Paternal Grandmother   . Colon cancer Neg Hx     Social History   Socioeconomic History  . Marital status: Married    Spouse name: Not on file  . Number of children: Not on file  . Years of  education: Not on file  . Highest education level: Not on file  Occupational History  . Not on file  Tobacco Use  . Smoking status: Current Every Day Smoker    Packs/day: 0.50    Years: 10.00    Pack years: 5.00    Types: Cigarettes  . Smokeless tobacco: Never Used  Substance and Sexual Activity  . Alcohol use: Yes    Comment: rare  . Drug use: No  . Sexual activity: Yes    Birth control/protection: None  Other Topics Concern  . Not on file  Social History Narrative  . Not on file   Social Determinants of Health   Financial Resource Strain:   . Difficulty of Paying Living Expenses:   Food Insecurity:   . Worried About Charity fundraiser in the Last Year:   . Arboriculturist in the Last Year:   Transportation Needs:   . Film/video editor (Medical):   Marland Kitchen Lack of Transportation (Non-Medical):   Physical Activity:   . Days of Exercise per Week:   . Minutes of Exercise per Session:   Stress:   . Feeling of Stress :   Social Connections:   . Frequency of Communication with Friends and Family:   . Frequency of Social Gatherings with Friends and Family:   . Attends Religious Services:   . Active Member of Clubs or Organizations:   . Attends Archivist Meetings:   Marland Kitchen Marital Status:     Subjective: Review of Systems  Constitutional: Positive for fever (subjective). Negative for chills, malaise/fatigue and weight loss.  HENT: Negative for congestion and sore throat.   Respiratory: Negative for cough and shortness of breath.   Cardiovascular: Negative for chest pain and palpitations.  Gastrointestinal: Positive for abdominal pain, constipation, nausea and vomiting (last emesis this morning). Negative for blood in stool, diarrhea and melena.  Musculoskeletal: Negative for joint pain and myalgias.  Skin: Negative for rash.  Neurological: Negative for dizziness and weakness.  Endo/Heme/Allergies: Does not bruise/bleed easily.  Psychiatric/Behavioral: Negative  for depression. The patient is not nervous/anxious.   All other systems reviewed and are negative.    Objective: BP 99/74   Pulse 89   Temp (!) 97.3 F (36.3 C) (Oral)   Ht 5\' 4"  (1.626 m)   Wt 196 lb 12.8 oz (89.3 kg)   BMI  33.78 kg/m  Physical Exam Vitals and nursing note reviewed.  Constitutional:      General: She is not in acute distress.    Appearance: Normal appearance. She is well-developed. She is obese. She is not ill-appearing, toxic-appearing or diaphoretic.  HENT:     Head: Normocephalic and atraumatic.     Nose: No congestion or rhinorrhea.  Eyes:     General: No scleral icterus. Cardiovascular:     Rate and Rhythm: Normal rate and regular rhythm.     Heart sounds: Normal heart sounds.  Pulmonary:     Effort: Pulmonary effort is normal. No respiratory distress.     Breath sounds: Normal breath sounds.  Abdominal:     General: Bowel sounds are decreased.     Palpations: Abdomen is soft. There is no hepatomegaly, splenomegaly or mass.     Tenderness: There is abdominal tenderness in the right upper quadrant, right lower quadrant, epigastric area and periumbilical area. There is no guarding or rebound.     Hernia: No hernia is present.  Skin:    General: Skin is warm and dry.     Coloration: Skin is not jaundiced.     Findings: No rash.  Neurological:     General: No focal deficit present.     Mental Status: She is alert and oriented to person, place, and time.  Psychiatric:        Attention and Perception: Attention normal.        Mood and Affect: Mood normal.        Speech: Speech normal.        Behavior: Behavior normal.        Thought Content: Thought content normal.        Cognition and Memory: Cognition and memory normal.       01/24/2020 4:04 PM   Disclaimer: This note was dictated with voice recognition software. Similar sounding words can inadvertently be transcribed and may not be corrected upon review.  

## 2020-01-24 NOTE — ED Triage Notes (Signed)
Pt presents to ED with complaints of recurrent abdominal pain in RUQ.

## 2020-01-24 NOTE — Progress Notes (Signed)
CC'ED TO PCP 

## 2020-01-31 ENCOUNTER — Telehealth: Payer: Self-pay | Admitting: Internal Medicine

## 2020-01-31 NOTE — Telephone Encounter (Signed)
Pt was seen by EG on 4/13 and said she hasn't improved and was in pain. She also had FMLA or short term disability papers faxed to [place] from [employer] that JL has. Please call her at [phone]

## 2020-01-31 NOTE — Telephone Encounter (Signed)
Working on [employer]. Waiting for clarification from EG on restrictions, etc.

## 2020-02-01 NOTE — Telephone Encounter (Signed)
Spoke with pt. Pt says she isn't able to eat much. Pt will eat a couple bites of food and the food comes back up. Pt had a problem with some of her liquids coming back up yesterday. Pt was able to drink a little chicken noodle soup and it stayed down. Pt is continuing to take her medications/ nausea meds. Pt is aware that FMLA paperwork is in process/being worked on.

## 2020-02-02 ENCOUNTER — Telehealth: Payer: Self-pay | Admitting: Internal Medicine

## 2020-02-02 ENCOUNTER — Emergency Department (HOSPITAL_COMMUNITY)
Admission: EM | Admit: 2020-02-02 | Discharge: 2020-02-02 | Disposition: A | Discharge status: Home / Self Care | Payer: Medicaid Other | Attending: Emergency Medicine | Admitting: Emergency Medicine

## 2020-02-02 ENCOUNTER — Emergency Department (HOSPITAL_COMMUNITY): Payer: Medicaid Other

## 2020-02-02 ENCOUNTER — Encounter (HOSPITAL_COMMUNITY): Payer: Self-pay

## 2020-02-02 ENCOUNTER — Other Ambulatory Visit: Payer: Self-pay

## 2020-02-02 DIAGNOSIS — K219 Gastro-esophageal reflux disease without esophagitis: Secondary | ICD-10-CM | POA: Insufficient documentation

## 2020-02-02 DIAGNOSIS — R112 Nausea with vomiting, unspecified: Secondary | ICD-10-CM | POA: Insufficient documentation

## 2020-02-02 DIAGNOSIS — R1011 Right upper quadrant pain: Secondary | ICD-10-CM | POA: Diagnosis present

## 2020-02-02 DIAGNOSIS — E876 Hypokalemia: Secondary | ICD-10-CM | POA: Insufficient documentation

## 2020-02-02 DIAGNOSIS — F17210 Nicotine dependence, cigarettes, uncomplicated: Secondary | ICD-10-CM | POA: Insufficient documentation

## 2020-02-02 LAB — URINALYSIS, ROUTINE W REFLEX MICROSCOPIC
Bilirubin Urine: NEGATIVE
Glucose, UA: NEGATIVE mg/dL
Hgb urine dipstick: NEGATIVE
Ketones, ur: NEGATIVE mg/dL
Leukocytes,Ua: NEGATIVE
Nitrite: NEGATIVE
Protein, ur: NEGATIVE mg/dL
Specific Gravity, Urine: 1.023 (ref 1.005–1.030)
pH: 6 (ref 5.0–8.0)

## 2020-02-02 LAB — COMPREHENSIVE METABOLIC PANEL
ALT: 18 U/L (ref 0–44)
AST: 20 U/L (ref 15–41)
Albumin: 3.9 g/dL (ref 3.5–5.0)
Alkaline Phosphatase: 57 U/L (ref 38–126)
Anion gap: 9 (ref 5–15)
BUN: 8 mg/dL (ref 6–20)
CO2: 25 mmol/L (ref 22–32)
Calcium: 8.5 mg/dL — ABNORMAL LOW (ref 8.9–10.3)
Chloride: 105 mmol/L (ref 98–111)
Creatinine, Ser: 0.7 mg/dL (ref 0.44–1.00)
GFR calc Af Amer: >60 mL/min (ref >60)
GFR calc non Af Amer: >60 mL/min (ref >60)
Glucose, Bld: 104 mg/dL — ABNORMAL HIGH (ref 70–99)
Potassium: 2.9 mmol/L — ABNORMAL LOW (ref 3.5–5.1)
Sodium: 139 mmol/L (ref 135–145)
Total Bilirubin: 0.3 mg/dL (ref 0.3–1.2)
Total Protein: 7 g/dL (ref 6.5–8.1)

## 2020-02-02 LAB — CBC WITH DIFFERENTIAL/PLATELET
Abs Immature Granulocytes: 0 10*3/uL (ref 0.00–0.07)
Basophils Absolute: 0 10*3/uL (ref 0.0–0.1)
Basophils Relative: 0 %
Eosinophils Absolute: 0.2 10*3/uL (ref 0.0–0.5)
Eosinophils Relative: 4 %
HCT: 39.9 % (ref 36.0–46.0)
Hemoglobin: 13 g/dL (ref 12.0–15.0)
Immature Granulocytes: 0 %
Lymphocytes Relative: 41 %
Lymphs Abs: 2.1 10*3/uL (ref 0.7–4.0)
MCH: 30.3 pg (ref 26.0–34.0)
MCHC: 32.6 g/dL (ref 30.0–36.0)
MCV: 93 fL (ref 80.0–100.0)
Monocytes Absolute: 0.4 10*3/uL (ref 0.1–1.0)
Monocytes Relative: 8 %
Neutro Abs: 2.4 10*3/uL (ref 1.7–7.7)
Neutrophils Relative %: 47 %
Platelets: 237 10*3/uL (ref 150–400)
RBC: 4.29 MIL/uL (ref 3.87–5.11)
RDW: 13 % (ref 11.5–15.5)
WBC: 5.1 10*3/uL (ref 4.0–10.5)
nRBC: 0 % (ref 0.0–0.2)

## 2020-02-02 LAB — PREGNANCY, URINE: Preg Test, Ur: NEGATIVE

## 2020-02-02 LAB — LIPASE, BLOOD: Lipase: 18 U/L (ref 11–51)

## 2020-02-02 MED ORDER — METOCLOPRAMIDE HCL 5 MG/ML IJ SOLN
10.0000 mg | Freq: Once | INTRAMUSCULAR | Status: AC
  Administered 2020-02-02: 10 mg via INTRAVENOUS
  Filled 2020-02-02: qty 2

## 2020-02-02 MED ORDER — LIDOCAINE VISCOUS HCL 2 % MT SOLN
15.0000 mL | Freq: Once | OROMUCOSAL | Status: AC
  Administered 2020-02-02: 15 mL via ORAL
  Filled 2020-02-02: qty 15

## 2020-02-02 MED ORDER — METOCLOPRAMIDE HCL 10 MG PO TABS: 10.0000 mg | ORAL_TABLET | Freq: Four times a day (QID) | ORAL | 0 refills | Status: DC

## 2020-02-02 MED ORDER — MORPHINE SULFATE (PF) 4 MG/ML IV SOLN
4.0000 mg | Freq: Once | INTRAVENOUS | Status: AC
  Administered 2020-02-02: 4 mg via INTRAVENOUS
  Filled 2020-02-02: qty 1

## 2020-02-02 MED ORDER — SUCRALFATE 1 GM/10ML PO SUSP: 1.0000 g | Freq: Four times a day (QID) | ORAL | 0 refills | Status: DC

## 2020-02-02 MED ORDER — ALUM & MAG HYDROXIDE-SIMETH 200-200-20 MG/5ML PO SUSP
30.0000 mL | Freq: Once | ORAL | Status: AC
  Administered 2020-02-02: 30 mL via ORAL
  Filled 2020-02-02: qty 30

## 2020-02-02 MED ORDER — SODIUM CHLORIDE 0.9 % IV BOLUS
1000.0000 mL | Freq: Once | INTRAVENOUS | Status: AC
  Administered 2020-02-02: 1000 mL via INTRAVENOUS

## 2020-02-02 MED ORDER — POTASSIUM CHLORIDE 10 MEQ/100ML IV SOLN
10.0000 meq | INTRAVENOUS | Status: AC
  Administered 2020-02-02 (×2): 10 meq via INTRAVENOUS
  Filled 2020-02-02 (×2): qty 100

## 2020-02-02 NOTE — ED Provider Notes (Signed)
[facility] EMERGENCY DEPARTMENT Provider Note   CSN: [id number] Arrival date & time: 02/02/20  1215     History Chief Complaint  Patient presents with  . Abdominal Pain    Samantha Russell is a 29 y.o. female with has medical history significant for gastric ulcers, bipolar, asthma, hepatitis C who presents for evaluation of abdominal pain.  Patient with chronic abdominal pain x2 weeks.  Patient states originally pain was generalized however now is located to right upper quadrant epigastric region.  Is having multiple episodes of emesis at home and is now no longer able to keep down even liquids.  She has been trying Zofran and Phenergan without relief.  Has not been taking anything for pain at home.  She rates her pain a 9/10.  One episode of diarrhea earlier today without melena or bright red blood per rectum.  She is followed by [facility].  Has endoscopy and colonoscopy scheduled for 02/13/2020.  Denies chronic NSAID use or alcohol use.  Denies fever, chills, chest pain, shortness of breath, hematemesis, dysuria, lower abdominal pain, flank pain, pelvic pain, vaginal discharge.  Pain does not radiate.  Denies additional aggravating or relieving factors.   She is seen on 01/24/2020 for similar complaints.  Had CT scan showed small right adnexal cyst likely ovarian in nature.  They did not see any gallstones, bladder wall thickening or biliary dilatation.  History obtained from patient and past medical records.  No interpreter is used.   GI- Walden Field, NP and Dr. Gala Romney   HPI     Past Medical History:  Diagnosis Date  . Asthma   . Bipolar 1 disorder ([place])   . Multiple gastric ulcers    in [place], [place]  . Stomach ulcer     Patient Active Problem List   Diagnosis Date Noted  . Nausea with vomiting 01/24/2020  . Hematemesis 11/22/2019  . Abnormal CT of the abdomen 11/22/2019  . Abdominal pain 09/27/2019  . Bloating 06/28/2019  . History of positive hepatitis C 04/27/2019  .  Constipation 04/27/2019  . GERD (gastroesophageal reflux disease) 04/27/2019  . Other forms of dyspnea 02/22/2019  . Perennial and seasonal allergic rhinitis 02/17/2019  . Penicillin allergy 02/17/2019  . Tobacco user 02/17/2019  . Mild persistent asthma 02/17/2019  . Rash and other nonspecific skin eruption 02/17/2019    Past Surgical History:  Procedure Laterality Date  . DILATION AND CURETTAGE OF UTERUS    . ESOPHAGOGASTRODUODENOSCOPY  2013   [place], [place]     OB History    Gravida  4   Para  2   Term  2   Preterm      AB  1   Living  2     SAB  1   TAB      Ectopic      Multiple      Live Births              Family History  Problem Relation Age of Onset  . Anxiety disorder Mother   . Depression Mother   . Heart disease Father   . Diabetes Maternal Grandmother   . Diabetes Paternal Grandmother   . Colon cancer Neg Hx     Social History   Tobacco Use  . Smoking status: Current Every Day Smoker    Packs/day: 0.50    Years: 10.00    Pack years: 5.00    Types: Cigarettes  . Smokeless tobacco: Never Used  Substance Use  Topics  . Alcohol use: Yes    Comment: rare  . Drug use: No    Home Medications Prior to Admission medications   Medication Sig Start Date End Date Taking? Authorizing Provider  albuterol (VENTOLIN HFA) 108 (90 Base) MCG/ACT inhaler Inhale 2 puffs into the lungs every 6 (six) hours as needed for wheezing or shortness of breath. 02/17/19  Yes Ellamae Sia, DO  AMITIZA 24 MCG capsule TAKE 1 CAPSULE (24 MCG TOTAL) BY MOUTH 2 (TWO) TIMES DAILY WITH A MEAL. Patient taking differently: Take 24 mcg by mouth 2 (two) times daily with a meal.  09/27/19  Yes Gelene Mink, NP  dicyclomine (BENTYL) 20 MG tablet Take 20 mg by mouth every 6 (six) hours as needed. 01/14/20  Yes [provider]  diphenhydrAMINE (BENADRYL) 25 MG tablet Take 25 mg by mouth every 6 (six) hours as needed for allergies.    Yes [provider]    fluticasone (FLONASE) 50 MCG/ACT nasal spray Take 1-2 sprays daily Patient taking differently: Place 2 sprays into both nostrils daily.  02/22/19  Yes Bobbitt, Heywood Iles, MD  fluticasone (FLOVENT HFA) 110 MCG/ACT inhaler Inhale 2 puffs into the lungs 2 (two) times a day. 02/22/19  Yes Bobbitt, Heywood Iles, MD  lamoTRIgine (LAMICTAL) 100 MG tablet Take 100 mg by mouth 2 (two) times daily.    Yes [provider]  ondansetron (ZOFRAN) 4 MG tablet Take 1 tablet (4 mg total) by mouth every 8 (eight) hours as needed for nausea or vomiting. 01/24/20  Yes Dartha Lodge, PA-C  pantoprazole (PROTONIX) 40 MG tablet Take 1 tablet (40 mg total) by mouth 2 (two) times daily before a meal. 01/24/20  Yes Anice Paganini, NP  sertraline (ZOLOFT) 25 MG tablet Take 50 mg by mouth daily.    Yes [provider]  metoCLOPramide (REGLAN) 10 MG tablet Take 1 tablet (10 mg total) by mouth every 6 (six) hours. 02/02/20   Samantha Russell A, PA-C  sucralfate (CARAFATE) 1 GM/10ML suspension Take 10 mLs (1 g total) by mouth 4 (four) times daily. 02/02/20   Samantha Russell A, PA-C    Allergies    Penicillins  Review of Systems   Review of Systems  Constitutional: Negative.   HENT: Negative.   Respiratory: Negative.   Cardiovascular: Negative.   Gastrointestinal: Positive for abdominal pain, diarrhea, nausea and vomiting. Negative for abdominal distention, anal bleeding, blood in stool, constipation and rectal pain.  Genitourinary: Negative.   Musculoskeletal: Negative.   Skin: Negative.   Neurological: Negative.   All other systems reviewed and are negative.   Physical Exam Updated Vital Signs BP 102/62   Pulse 66   Temp 98 F (36.7 C) (Oral)   Resp 16   Ht 5\' 4"  (1.626 m)   Wt 88.9 kg   SpO2 100%   BMI 33.64 kg/m   Physical Exam Vitals and nursing note reviewed.  Constitutional:      General: She is not in acute distress.    Appearance: She is well-developed. She is not  ill-appearing, toxic-appearing or diaphoretic.  HENT:     Head: Normocephalic and atraumatic.     Mouth/Throat:     Mouth: Mucous membranes are moist.     Pharynx: Oropharynx is clear.  Eyes:     Pupils: Pupils are equal, round, and reactive to light.  Cardiovascular:     Rate and Rhythm: Normal rate.     Heart sounds: Normal heart sounds.  Pulmonary:     Effort: Pulmonary effort is normal. No respiratory distress.     Breath sounds: Normal breath sounds.  Abdominal:     General: Bowel sounds are normal. There is no distension.     Palpations: Abdomen is soft.     Tenderness: There is abdominal tenderness in the right upper quadrant and epigastric area. There is no right CVA tenderness, left CVA tenderness, guarding or rebound. Negative signs include Murphy's sign and McBurney's sign.     Hernia: No hernia is present.  Musculoskeletal:        General: Normal range of motion.     Cervical back: Normal range of motion.  Skin:    General: Skin is warm and dry.     Capillary Refill: Capillary refill takes less than 2 seconds.  Neurological:     Mental Status: She is alert.    ED Results / Procedures / Treatments   Labs (all labs ordered are listed, but only abnormal results are displayed) Labs Reviewed  COMPREHENSIVE METABOLIC PANEL - Abnormal; Notable for the following components:      Result Value   Potassium 2.9 (*)    Glucose, Bld 104 (*)    Calcium 8.5 (*)    All other components within normal limits  URINALYSIS, ROUTINE W REFLEX MICROSCOPIC - Abnormal; Notable for the following components:   Color, Urine AMBER (*)    APPearance CLOUDY (*)    All other components within normal limits  CBC WITH DIFFERENTIAL/PLATELET  LIPASE, BLOOD  PREGNANCY, URINE    EKG None  Radiology US Abdomen Limited RUQ  Result Date: 02/02/2020 CLINICAL DATA:  Acute right upper quadrant abdominal pain. EXAM: ULTRASOUND ABDOMEN LIMITED RIGHT UPPER QUADRANT COMPARISON:  January 24, 2020.  FINDINGS: Gallbladder: No gallstones or wall thickening visualized. No sonographic Murphy sign noted by sonographer. Common bile duct: Diameter: 5 mm which is within normal limits. Liver: No focal lesion identified. Within normal limits in parenchymal echogenicity. Portal vein is patent on color Doppler imaging with normal direction of blood flow towards the liver. Other: None. IMPRESSION: No definite abnormality seen in the right upper quadrant of the abdomen. Electronically Signed   By: Lupita Raider M.D.   On: 02/02/2020 13:54    Procedures Procedures (including critical care time)  Medications Ordered in ED Medications  sodium chloride 0.9 % bolus 1,000 mL (0 mLs Intravenous Stopped 02/02/20 1653)  morphine 4 MG/ML injection 4 mg (4 mg Intravenous Given 02/02/20 1351)  metoCLOPramide (REGLAN) injection 10 mg (10 mg Intravenous Given 02/02/20 1350)  potassium chloride 10 mEq in 100 mL IVPB (0 mEq Intravenous Stopped 02/02/20 1653)  alum & mag hydroxide-simeth (MAALOX/MYLANTA) 200-200-20 MG/5ML suspension 30 mL (30 mLs Oral Given 02/02/20 1549)    And  lidocaine (XYLOCAINE) 2 % viscous mouth solution 15 mL (15 mLs Oral Given 02/02/20 1549)    ED Course  I have reviewed the triage vital signs and the nursing notes.  Pertinent labs & imaging results that were available during my care of the patient were reviewed by me and considered in my medical decision making (see chart for details).  29 year old female presents for evaluation abdominal pain.  Has been seen multiple times in the past for similar.  She is afebrile, nonseptic, not ill-appearing.  She is followed by [facility].  Has having worsening nausea and emesis and is unable to keep down liquids at home.  She has been trying Zofran and Phenergan without relief.  Pain located  to right upper quadrant.  Had CT scan which I personally reviewed less than 2 weeks ago which showed small right ovarian cyst no additional acute findings.  Patient  has no tenderness to her right lower quadrant.  She is tender to epigastric and right upper quadrant however no rebound and negative Murphy sign.  Patient appears overall well and well-hydrated.  She is not had prior ultrasound imaging.  We will plan on ultrasound right upper quadrant, IV fluids, antiemetics, pain management and reassess  Labs and imaging personally reviewed and interpreted: CBC without leukocytosis, Hgb 13.0 CMP Hypokalemia at 2.9, will replace. Hyperglycemia at 104 Lipase 18 UA negative for UTI Preg negative [place] RUQ negative for cholecystitis, cholelithiasis. No duct dilatation.   1530: Patient reassessed. Pain improved. Emesis resolved with Reglan. Dicussed labs and imaging. Plan to PO challenege.  Patient reassessed. Tolerating PO intake without difficulty. No emesis. Abd pain controlled. Will start on Carafate, switch her Zofran/Phenergan for Reglan.  She is to keep close follow-up with GI or she may return for any worsening symptoms.  Patient is nontoxic, nonseptic appearing, in no apparent distress.  Patient's pain and other symptoms adequately managed in emergency department.  Fluid bolus given.  Labs, imaging and vitals reviewed.  Patient does not meet the SIRS or Sepsis criteria.  On repeat exam patient does not have a surgical abdomin and there are no peritoneal signs.  No indication of appendicitis, bowel obstruction, bowel perforation, cholecystitis, diverticulitis, PID or ectopic pregnancy.  Patient discharged home with symptomatic treatment and given strict instructions for follow-up with their primary care physician.  I have also discussed reasons to return immediately to the ER.  Patient expresses understanding and agrees with plan.     MDM Rules/Calculators/A&P                       Final Clinical Impression(s) / ED Diagnoses Final diagnoses:  RUQ abdominal pain  Non-intractable vomiting with nausea, unspecified vomiting type    Rx / DC Orders ED  Discharge Orders         Ordered    metoCLOPramide (REGLAN) 10 MG tablet  Every 6 hours     02/02/20 1710    sucralfate (CARAFATE) 1 GM/10ML suspension  4 times daily     02/02/20 1710           Yvetta Drotar A, PA-C 02/02/20 1711    Long, Arlyss Repress, MD 02/03/20 [phone]

## 2020-02-02 NOTE — ED Triage Notes (Signed)
Pt presents with complaints of recurrent abdominal pain in RUQ.   Had episode of diarrhea on self today.   Reports unable to tolerate oral foods/liquids.  Reports having episodes of belching and "farting"  x12 hours last Saturday.   States "I came here because nothing is getting better".  Pt is followed by [name], gastrologist. Has upcoming appt on May 3rd for colonoscopy and biopsy.

## 2020-02-02 NOTE — ED Notes (Signed)
Pt drank 120 ml of water and 120 ml of ginger ale with no vomiting, she admits to slight nausea.

## 2020-02-02 NOTE — Telephone Encounter (Signed)
FYI Pt called this morning 02/02/20. Pt isn't able to keep foods down and is in some pain. Pt decided to go to the ED.

## 2020-02-02 NOTE — Discharge Instructions (Signed)
Stop taking the Zofran and Phenergan.  You may take the Reglan.  Also take the Carafate with meals.  You may continue to take your acid reducer as well.  Follow-up with GI.

## 2020-02-02 NOTE — Telephone Encounter (Signed)
Spoke with pt. See documentation in other phone note.

## 2020-02-02 NOTE — Telephone Encounter (Signed)
Please call patient, she called yesterday and is wanting recommendations on what she can do

## 2020-02-02 NOTE — ED Notes (Deleted)
Pt noted to be getting more aggressive, crying walking around her room, Upset that she can not just go home. Continues to say that she is pregnant, and not crazy. Pt will yell at nurse when talking at times, and threatens to call lawyers and sue the hospital. Charge Nurse came into pt room with said nurse due to concern of  Said nurse safety due to escalation. Dr. And EDP aware of IVC paperwork to begin.  

## 2020-02-03 NOTE — Telephone Encounter (Signed)
Noted. Will see as outpatient as scheduled. Can call if worsening before then

## 2020-02-07 NOTE — Patient Instructions (Signed)
Lubna Stegeman  02/07/2020     @PREFPERIOPPHARMACY @   Your procedure is scheduled on  02/13/2020 .  Report to [facility] at  0900  A.M.  Call this number if you have problems the morning of surgery:  [phone]   Remember:  Follow the diet and prep instructions given to you by Dr [phone] office.                    Take these medicines the morning of surgery with A SIP OF WATER  Bentyl, lamictal, reglan, zofran(if needed), protonix, zoloft.    Do not wear jewelry, make-up or nail polish.  Do not wear lotions, powders, or perfumes. Please wear deodorant and brush your teeth.  Do not shave 48 hours prior to surgery.  Men may shave face and neck.  Do not bring valuables to the hospital.  [facility] is not responsible for any belongings or valuables.  Contacts, dentures or bridgework may not be worn into surgery.  Leave your suitcase in the car.  After surgery it may be brought to your room.  For patients admitted to the hospital, discharge time will be determined by your treatment team.  Patients discharged the day of surgery will not be allowed to drive home.   Name and phone number of your driver:   family Special instructions:  DO NOT smoke the day of your procedure.  Please read over the following fact sheets that you were given. Anesthesia Post-op Instructions and Care and Recovery After Surgery       Upper Endoscopy, Adult, Care After This sheet gives you information about how to care for yourself after your procedure. Your health care provider may also give you more specific instructions. If you have problems or questions, contact your health care provider. What can I expect after the procedure? After the procedure, it is common to have:  A sore throat.  Mild stomach pain or discomfort.  Bloating.  Nausea. Follow these instructions at home:   Follow instructions from your health care provider about what to eat or drink after your  procedure.  Return to your normal activities as told by your health care provider. Ask your health care provider what activities are safe for you.  Take over-the-counter and prescription medicines only as told by your health care provider.  Do not drive for 24 hours if you were given a sedative during your procedure.  Keep all follow-up visits as told by your health care provider. This is important. Contact a health care provider if you have:  A sore throat that lasts longer than one day.  Trouble swallowing. Get help right away if:  You vomit blood or your vomit looks like coffee grounds.  You have: ? A fever. ? Bloody, black, or tarry stools. ? A severe sore throat or you cannot swallow. ? Difficulty breathing. ? Severe pain in your chest or abdomen. Summary  After the procedure, it is common to have a sore throat, mild stomach discomfort, bloating, and nausea.  Do not drive for 24 hours if you were given a sedative during the procedure.  Follow instructions from your health care provider about what to eat or drink after your procedure.  Return to your normal activities as told by your health care provider. This information is not intended to replace advice given to you by your health care provider. Make sure you discuss any questions you have with your health  care provider. Document Revised: 03/23/2018 Document Reviewed: 03/01/2018 Elsevier Patient Education  [place].  Colonoscopy, Adult, Care After This sheet gives you information about how to care for yourself after your procedure. Your health care provider may also give you more specific instructions. If you have problems or questions, contact your health care provider. What can I expect after the procedure? After the procedure, it is common to have:  A small amount of blood in your stool for 24 hours after the procedure.  Some gas.  Mild cramping or bloating of your abdomen. Follow these instructions  at home: Eating and drinking   Drink enough fluid to keep your urine pale yellow.  Follow instructions from your health care provider about eating or drinking restrictions.  Resume your normal diet as instructed by your health care provider. Avoid heavy or fried foods that are hard to digest. Activity  Rest as told by your health care provider.  Avoid sitting for a long time without moving. Get up to take short walks every 1-2 hours. This is important to improve blood flow and breathing. Ask for help if you feel weak or unsteady.  Return to your normal activities as told by your health care provider. Ask your health care provider what activities are safe for you. Managing cramping and bloating   Try walking around when you have cramps or feel bloated.  Apply heat to your abdomen as told by your health care provider. Use the heat source that your health care provider recommends, such as a moist heat pack or a heating pad. ? Place a towel between your skin and the heat source. ? Leave the heat on for 20-30 minutes. ? Remove the heat if your skin turns bright red. This is especially important if you are unable to feel pain, heat, or cold. You may have a greater risk of getting burned. General instructions  For the first 24 hours after the procedure: ? Do not drive or use machinery. ? Do not sign important documents. ? Do not drink alcohol. ? Do your regular daily activities at a slower pace than normal. ? Eat soft foods that are easy to digest.  Take over-the-counter and prescription medicines only as told by your health care provider.  Keep all follow-up visits as told by your health care provider. This is important. Contact a health care provider if:  You have blood in your stool 2-3 days after the procedure. Get help right away if you have:  More than a small spotting of blood in your stool.  Large blood clots in your stool.  Swelling of your abdomen.  Nausea or  vomiting.  A fever.  Increasing pain in your abdomen that is not relieved with medicine. Summary  After the procedure, it is common to have a small amount of blood in your stool. You may also have mild cramping and bloating of your abdomen.  For the first 24 hours after the procedure, do not drive or use machinery, sign important documents, or drink alcohol.  Get help right away if you have a lot of blood in your stool, nausea or vomiting, a fever, or increased pain in your abdomen. This information is not intended to replace advice given to you by your health care provider. Make sure you discuss any questions you have with your health care provider. Document Revised: 04/25/2019 Document Reviewed: 04/25/2019 Elsevier Patient Education  [place] After These instructions provide you with information about  caring for yourself after your procedure. Your health care provider may also give you more specific instructions. Your treatment has been planned according to current medical practices, but problems sometimes occur. Call your health care provider if you have any problems or questions after your procedure. What can I expect after the procedure? After your procedure, you may:  Feel sleepy for several hours.  Feel clumsy and have poor balance for several hours.  Feel forgetful about what happened after the procedure.  Have poor judgment for several hours.  Feel nauseous or vomit.  Have a sore throat if you had a breathing tube during the procedure. Follow these instructions at home: For at least 24 hours after the procedure:      Have a responsible adult stay with you. It is important to have someone help care for you until you are awake and alert.  Rest as needed.  Do not: ? Participate in activities in which you could fall or become injured. ? Drive. ? Use heavy machinery. ? Drink alcohol. ? Take sleeping pills or medicines that  cause drowsiness. ? Make important decisions or sign legal documents. ? Take care of children on your own. Eating and drinking  Follow the diet that is recommended by your health care provider.  If you vomit, drink water, juice, or soup when you can drink without vomiting.  Make sure you have little or no nausea before eating solid foods. General instructions  Take over-the-counter and prescription medicines only as told by your health care provider.  If you have sleep apnea, surgery and certain medicines can increase your risk for breathing problems. Follow instructions from your health care provider about wearing your sleep device: ? Anytime you are sleeping, including during daytime naps. ? While taking prescription pain medicines, sleeping medicines, or medicines that make you drowsy.  If you smoke, do not smoke without supervision.  Keep all follow-up visits as told by your health care provider. This is important. Contact a health care provider if:  You keep feeling nauseous or you keep vomiting.  You feel light-headed.  You develop a rash.  You have a fever. Get help right away if:  You have trouble breathing. Summary  For several hours after your procedure, you may feel sleepy and have poor judgment.  Have a responsible adult stay with you for at least 24 hours or until you are awake and alert. This information is not intended to replace advice given to you by your health care provider. Make sure you discuss any questions you have with your health care provider. Document Revised: 12/28/2017 Document Reviewed: 01/20/2016 Elsevier Patient Education  [place].

## 2020-02-09 ENCOUNTER — Encounter (HOSPITAL_COMMUNITY): Payer: Self-pay

## 2020-02-09 ENCOUNTER — Other Ambulatory Visit (HOSPITAL_COMMUNITY)
Admission: RE | Admit: 2020-02-09 | Discharge: 2020-02-09 | Disposition: A | Discharge status: Home / Self Care | Payer: Medicaid Other | Source: Ambulatory Visit | Attending: Internal Medicine | Admitting: Internal Medicine

## 2020-02-09 ENCOUNTER — Telehealth: Payer: Self-pay | Admitting: *Deleted

## 2020-02-09 ENCOUNTER — Other Ambulatory Visit: Payer: Self-pay

## 2020-02-09 ENCOUNTER — Encounter (HOSPITAL_COMMUNITY)
Admission: RE | Admit: 2020-02-09 | Discharge: 2020-02-09 | Disposition: A | Discharge status: Home / Self Care | Payer: Medicaid Other | Source: Ambulatory Visit | Attending: Internal Medicine | Admitting: Internal Medicine

## 2020-02-09 DIAGNOSIS — Z20822 Contact with and (suspected) exposure to covid-19: Secondary | ICD-10-CM | POA: Insufficient documentation

## 2020-02-09 DIAGNOSIS — Z01812 Encounter for preprocedural laboratory examination: Secondary | ICD-10-CM | POA: Diagnosis present

## 2020-02-09 LAB — BASIC METABOLIC PANEL
Anion gap: 12 (ref 5–15)
BUN: 12 mg/dL (ref 6–20)
CO2: 24 mmol/L (ref 22–32)
Calcium: 8.8 mg/dL — ABNORMAL LOW (ref 8.9–10.3)
Chloride: 105 mmol/L (ref 98–111)
Creatinine, Ser: 0.68 mg/dL (ref 0.44–1.00)
GFR calc Af Amer: >60 mL/min (ref >60)
GFR calc non Af Amer: >60 mL/min (ref >60)
Glucose, Bld: 92 mg/dL (ref 70–99)
Potassium: 3.6 mmol/L (ref 3.5–5.1)
Sodium: 141 mmol/L (ref 135–145)

## 2020-02-09 LAB — HCG, SERUM, QUALITATIVE: Preg, Serum: NEGATIVE

## 2020-02-09 NOTE — Telephone Encounter (Signed)
Paperwork done and given to Susan.  

## 2020-02-09 NOTE — Telephone Encounter (Signed)
Patient called in. She went to ED 02/02/2020. She is having more constant RUQ pain, nausea but no vomiting, regurgitation of acid per pt. She is only able to eat a "half cup size" amount. She is scheduled for EGD/TCS 02/13/20 with RMR.

## 2020-02-09 NOTE — Telephone Encounter (Signed)
Lmom, waiting on a return call.  

## 2020-02-10 ENCOUNTER — Encounter (HOSPITAL_COMMUNITY): Payer: Medicaid Other

## 2020-02-10 ENCOUNTER — Other Ambulatory Visit (HOSPITAL_COMMUNITY): Payer: Medicaid Other

## 2020-02-10 LAB — SARS CORONAVIRUS 2 (TAT 6-24 HRS): SARS Coronavirus 2: NEGATIVE

## 2020-02-10 NOTE — Telephone Encounter (Signed)
Pt returned call. Pt is still not able to eat more that half a cup at a time. When pt does, she vomits it back up. Pt is having a lot of belching and acid building up in her mouth. Pt is taking Pantoprazole 40 mg bid as directed and has Zofran on hand. Pt is scheduled for her procedure TCS 5/3 with RMR and has an apt with EG on 03/07/20. Pt called last week and went to the ED since she felt so bad.

## 2020-02-13 ENCOUNTER — Other Ambulatory Visit: Payer: Self-pay

## 2020-02-13 ENCOUNTER — Ambulatory Visit (HOSPITAL_COMMUNITY): Payer: Medicaid Other | Admitting: Anesthesiology

## 2020-02-13 ENCOUNTER — Ambulatory Visit (HOSPITAL_COMMUNITY)
Admission: RE | Admit: 2020-02-13 | Discharge: 2020-02-13 | Disposition: A | Discharge status: Home / Self Care | Payer: Medicaid Other | Attending: Internal Medicine | Admitting: Internal Medicine

## 2020-02-13 ENCOUNTER — Encounter (HOSPITAL_COMMUNITY)
Admission: RE | Disposition: A | Discharge status: Home / Self Care | Payer: Self-pay | Source: Home / Self Care | Attending: Internal Medicine

## 2020-02-13 ENCOUNTER — Encounter (HOSPITAL_COMMUNITY): Payer: Self-pay | Admitting: Internal Medicine

## 2020-02-13 DIAGNOSIS — Z79899 Other long term (current) drug therapy: Secondary | ICD-10-CM | POA: Insufficient documentation

## 2020-02-13 DIAGNOSIS — J45909 Unspecified asthma, uncomplicated: Secondary | ICD-10-CM | POA: Insufficient documentation

## 2020-02-13 DIAGNOSIS — F319 Bipolar disorder, unspecified: Secondary | ICD-10-CM | POA: Insufficient documentation

## 2020-02-13 DIAGNOSIS — R1013 Epigastric pain: Secondary | ICD-10-CM | POA: Diagnosis not present

## 2020-02-13 DIAGNOSIS — R933 Abnormal findings on diagnostic imaging of other parts of digestive tract: Secondary | ICD-10-CM | POA: Insufficient documentation

## 2020-02-13 DIAGNOSIS — F17210 Nicotine dependence, cigarettes, uncomplicated: Secondary | ICD-10-CM | POA: Diagnosis not present

## 2020-02-13 DIAGNOSIS — K449 Diaphragmatic hernia without obstruction or gangrene: Secondary | ICD-10-CM | POA: Diagnosis not present

## 2020-02-13 DIAGNOSIS — Z7951 Long term (current) use of inhaled steroids: Secondary | ICD-10-CM | POA: Insufficient documentation

## 2020-02-13 DIAGNOSIS — K2100 Gastro-esophageal reflux disease with esophagitis, without bleeding: Secondary | ICD-10-CM | POA: Insufficient documentation

## 2020-02-13 DIAGNOSIS — K5909 Other constipation: Secondary | ICD-10-CM | POA: Insufficient documentation

## 2020-02-13 DIAGNOSIS — K648 Other hemorrhoids: Secondary | ICD-10-CM | POA: Insufficient documentation

## 2020-02-13 DIAGNOSIS — K222 Esophageal obstruction: Secondary | ICD-10-CM | POA: Diagnosis not present

## 2020-02-13 DIAGNOSIS — K644 Residual hemorrhoidal skin tags: Secondary | ICD-10-CM | POA: Insufficient documentation

## 2020-02-13 HISTORY — PX: ESOPHAGOGASTRODUODENOSCOPY (EGD) WITH PROPOFOL: SHX5813

## 2020-02-13 HISTORY — PX: COLONOSCOPY WITH PROPOFOL: SHX5780

## 2020-02-13 SURGERY — COLONOSCOPY WITH PROPOFOL
Anesthesia: General

## 2020-02-13 MED ORDER — MIDAZOLAM HCL 5 MG/5ML IJ SOLN
INTRAMUSCULAR | Status: DC | PRN
  Administered 2020-02-13 (×2): 1 mg via INTRAVENOUS

## 2020-02-13 MED ORDER — LIDOCAINE VISCOUS HCL 2 % MT SOLN
15.0000 mL | Freq: Once | OROMUCOSAL | Status: AC
  Administered 2020-02-13: 15 mL via OROMUCOSAL

## 2020-02-13 MED ORDER — GLYCOPYRROLATE 0.2 MG/ML IJ SOLN
INTRAMUSCULAR | Status: AC
  Filled 2020-02-13: qty 1

## 2020-02-13 MED ORDER — KETAMINE HCL 50 MG/5ML IJ SOSY
PREFILLED_SYRINGE | INTRAMUSCULAR | Status: AC
  Filled 2020-02-13: qty 5

## 2020-02-13 MED ORDER — LACTATED RINGERS IV SOLN: INTRAVENOUS | Status: DC

## 2020-02-13 MED ORDER — MIDAZOLAM HCL 2 MG/2ML IJ SOLN
INTRAMUSCULAR | Status: AC
  Filled 2020-02-13: qty 2

## 2020-02-13 MED ORDER — PROPOFOL 10 MG/ML IV BOLUS
INTRAVENOUS | Status: AC
  Filled 2020-02-13: qty 20

## 2020-02-13 MED ORDER — GLYCOPYRROLATE 0.2 MG/ML IJ SOLN
0.2000 mg | Freq: Once | INTRAMUSCULAR | Status: AC
  Administered 2020-02-13: 0.2 mg via INTRAVENOUS

## 2020-02-13 MED ORDER — KETAMINE HCL 10 MG/ML IJ SOLN
INTRAMUSCULAR | Status: DC | PRN
  Administered 2020-02-13: 20 mg via INTRAVENOUS
  Administered 2020-02-13: 10 mg via INTRAVENOUS
  Administered 2020-02-13: 20 mg via INTRAVENOUS

## 2020-02-13 MED ORDER — LIDOCAINE VISCOUS HCL 2 % MT SOLN
OROMUCOSAL | Status: AC
  Filled 2020-02-13: qty 15

## 2020-02-13 MED ORDER — PROPOFOL 10 MG/ML IV BOLUS
INTRAVENOUS | Status: DC | PRN
  Administered 2020-02-13 (×6): 50 mg via INTRAVENOUS
  Administered 2020-02-13: 100 mg via INTRAVENOUS
  Administered 2020-02-13 (×2): 50 mg via INTRAVENOUS

## 2020-02-13 NOTE — Anesthesia Postprocedure Evaluation (Signed)
Anesthesia Post Note  Patient: Samantha Russell  Procedure(s) Performed: COLONOSCOPY WITH PROPOFOL (N/A ) ESOPHAGOGASTRODUODENOSCOPY (EGD) WITH PROPOFOL (N/A )  Patient location during evaluation: PACU Anesthesia Type: General Level of consciousness: awake and alert Pain management: pain level controlled Vital Signs Assessment: post-procedure vital signs reviewed and stable Respiratory status: spontaneous breathing, nonlabored ventilation, respiratory function stable and patient connected to nasal cannula oxygen Cardiovascular status: blood pressure returned to baseline and stable Postop Assessment: no apparent nausea or vomiting Anesthetic complications: no     Last Vitals: There were no vitals filed for this visit.  Last Pain: There were no vitals filed for this visit.               Ronnae Kaser

## 2020-02-13 NOTE — Telephone Encounter (Signed)
Patient had TCS and EGD today. Protonix was stopped, started on Dexilant. OV in about 3 weeks. We can try and call her tomorrow to ask her to call us for any worsening. If needed, we could consider Carafate as well. Further recommendations pending clinical response to Dexilant.

## 2020-02-13 NOTE — Interval H&P Note (Signed)
History and Physical Interval Note:  02/13/2020 10:57 AM  Samantha Russell  has presented today for surgery, with the diagnosis of Abd pain, hematemesis, abnormal CT.  The various methods of treatment have been discussed with the patient and family. After consideration of risks, benefits and other options for treatment, the patient has consented to  Procedure(s) with comments: COLONOSCOPY WITH PROPOFOL (N/A) - 10:30 ESOPHAGOGASTRODUODENOSCOPY (EGD) WITH PROPOFOL (N/A) as a surgical intervention.  The patient's history has been reviewed, patient examined, no change in status, stable for surgery.  I have reviewed the patient's chart and labs.  Questions were answered to the patient's satisfaction.     Samantha Russell    No change.  Denies dysphagia.  EGD and colonoscopy per plan.  The risks, benefits, limitations, imponderables and alternatives regarding both EGD and colonoscopy have been reviewed with the patient. Questions have been answered. All parties agreeable.

## 2020-02-13 NOTE — Anesthesia Preprocedure Evaluation (Signed)
Anesthesia Evaluation  Patient identified by MRN, date of birth, ID band Patient awake    Reviewed: Allergy & Precautions, H&P , NPO status , Patient's Chart, lab work & pertinent test results, reviewed documented beta blocker date and time   Airway Mallampati: II  TM Distance: >3 FB Neck ROM: full    Dental no notable dental hx. (+) Teeth Intact   Pulmonary asthma , Current Smoker,    Pulmonary exam normal breath sounds clear to auscultation       Cardiovascular Exercise Tolerance: Good negative cardio ROS   Rhythm:regular Rate:Normal     Neuro/Psych negative neurological ROS  negative psych ROS   GI/Hepatic PUD, GERD  ,(+) Hepatitis -, C  Endo/Other  negative endocrine ROS  Renal/GU negative Renal ROS  negative genitourinary   Musculoskeletal   Abdominal   Peds  Hematology negative hematology ROS (+)   Anesthesia Other Findings   Reproductive/Obstetrics negative OB ROS                             Anesthesia Physical Anesthesia Plan  ASA: II  Anesthesia Plan: General   Post-op Pain Management:    Induction:   PONV Risk Score and Plan: Propofol infusion  Airway Management Planned:   Additional Equipment:   Intra-op Plan:   Post-operative Plan:   Informed Consent: I have reviewed the patients History and Physical, chart, labs and discussed the procedure including the risks, benefits and alternatives for the proposed anesthesia with the patient or authorized representative who has indicated his/her understanding and acceptance.     Dental Advisory Given  Plan Discussed with: CRNA  Anesthesia Plan Comments:         Anesthesia Quick Evaluation

## 2020-02-13 NOTE — Discharge Instructions (Signed)
Colonoscopy Discharge Instructions  Read the instructions outlined below and refer to this sheet in the next few weeks. These discharge instructions provide you with general information on caring for yourself after you leave the hospital. Your doctor may also give you specific instructions. While your treatment has been planned according to the most current medical practices available, unavoidable complications occasionally occur. If you have any problems or questions after discharge, call Dr. Gala Romney at [phone]. ACTIVITY  You may resume your regular activity, but move at a slower pace for the next 24 hours.   Take frequent rest periods for the next 24 hours.   Walking will help get rid of the air and reduce the bloated feeling in your belly (abdomen).   No driving for 24 hours (because of the medicine (anesthesia) used during the test).    Do not sign any important legal documents or operate any machinery for 24 hours (because of the anesthesia used during the test).  NUTRITION  Drink plenty of fluids.   You may resume your normal diet as instructed by your doctor.   Begin with a light meal and progress to your normal diet. Heavy or fried foods are harder to digest and may make you feel sick to your stomach (nauseated).   Avoid alcoholic beverages for 24 hours or as instructed.  MEDICATIONS  You may resume your normal medications unless your doctor tells you otherwise.  WHAT YOU CAN EXPECT TODAY  Some feelings of bloating in the abdomen.   Passage of more gas than usual.   Spotting of blood in your stool or on the toilet paper.  IF YOU HAD POLYPS REMOVED DURING THE COLONOSCOPY:  No aspirin products for 7 days or as instructed.   No alcohol for 7 days or as instructed.   Eat a soft diet for the next 24 hours.  FINDING OUT THE RESULTS OF YOUR TEST Not all test results are available during your visit. If your test results are not back during the visit, make an appointment  with your caregiver to find out the results. Do not assume everything is normal if you have not heard from your caregiver or the medical facility. It is important for you to follow up on all of your test results.  SEEK IMMEDIATE MEDICAL ATTENTION IF:  You have more than a spotting of blood in your stool.   Your belly is swollen (abdominal distention).   You are nauseated or vomiting.   You have a temperature over 101.   You have abdominal pain or discomfort that is severe or gets worse throughout the day.  EGD Discharge instructions Please read the instructions outlined below and refer to this sheet in the next few weeks. These discharge instructions provide you with general information on caring for yourself after you leave the hospital. Your doctor may also give you specific instructions. While your treatment has been planned according to the most current medical practices available, unavoidable complications occasionally occur. If you have any problems or questions after discharge, please call your doctor. ACTIVITY  You may resume your regular activity but move at a slower pace for the next 24 hours.   Take frequent rest periods for the next 24 hours.   Walking will help expel (get rid of) the air and reduce the bloated feeling in your abdomen.   No driving for 24 hours (because of the anesthesia (medicine) used during the test).   You may shower.   Do not sign any important  legal documents or operate any machinery for 24 hours (because of the anesthesia used during the test).  NUTRITION  Drink plenty of fluids.   You may resume your normal diet.   Begin with a light meal and progress to your normal diet.   Avoid alcoholic beverages for 24 hours or as instructed by your caregiver.  MEDICATIONS  You may resume your normal medications unless your caregiver tells you otherwise.  WHAT YOU CAN EXPECT TODAY  You may experience abdominal discomfort such as a feeling of fullness  or "gas" pains.  FOLLOW-UP  Your doctor will discuss the results of your test with you.  SEEK IMMEDIATE MEDICAL ATTENTION IF ANY OF THE FOLLOWING OCCUR:  Excessive nausea (feeling sick to your stomach) and/or vomiting.   Severe abdominal pain and distention (swelling).   Trouble swallowing.   Temperature over 101 F (37.8 C).   Rectal bleeding or vomiting of blood.   Gastroesophageal Reflux Disease, Adult Gastroesophageal reflux (GER) happens when acid from the stomach flows up into the tube that connects the mouth and the stomach (esophagus). Normally, food travels down the esophagus and stays in the stomach to be digested. However, when a person has GER, food and stomach acid sometimes move back up into the esophagus. If this becomes a more serious problem, the person may be diagnosed with a disease called gastroesophageal reflux disease (GERD). GERD occurs when the reflux: Happens often. Causes frequent or severe symptoms. Causes problems such as damage to the esophagus. When stomach acid comes in contact with the esophagus, the acid may cause soreness (inflammation) in the esophagus. Over time, GERD may create small holes (ulcers) in the lining of the esophagus. What are the causes? This condition is caused by a problem with the muscle between the esophagus and the stomach (lower esophageal sphincter, or LES). Normally, the LES muscle closes after food passes through the esophagus to the stomach. When the LES is weakened or abnormal, it does not close properly, and that allows food and stomach acid to go back up into the esophagus. The LES can be weakened by certain dietary substances, medicines, and medical conditions, including: Tobacco use. Pregnancy. Having a hiatal hernia. Alcohol use. Certain foods and beverages, such as coffee, chocolate, onions, and peppermint. What increases the risk? You are more likely to develop this condition if you: Have an increased body  weight. Have a connective tissue disorder. Use NSAID medicines. What are the signs or symptoms? Symptoms of this condition include: Heartburn. Difficult or painful swallowing. The feeling of having a lump in the throat. Abitter taste in the mouth. Bad breath. Having a large amount of saliva. Having an upset or bloated stomach. Belching. Chest pain. Different conditions can cause chest pain. Make sure you see your health care provider if you experience chest pain. Shortness of breath or wheezing. Ongoing (chronic) cough or a night-time cough. Wearing away of tooth enamel. Weight loss. How is this diagnosed? Your health care provider will take a medical history and perform a physical exam. To determine if you have mild or severe GERD, your health care provider may also monitor how you respond to treatment. You may also have tests, including: A test to examine your stomach and esophagus with a small camera (endoscopy). A test thatmeasures the acidity level in your esophagus. A test thatmeasures how much pressure is on your esophagus. A barium swallow or modified barium swallow test to show the shape, size, and functioning of your esophagus. How is this  treated? The goal of treatment is to help relieve your symptoms and to prevent complications. Treatment for this condition may vary depending on how severe your symptoms are. Your health care provider may recommend: Changes to your diet. Medicine. Surgery. Follow these instructions at home: Eating and drinking  Follow a diet as recommended by your health care provider. This may involve avoiding foods and drinks such as: Coffee and tea (with or without caffeine). Drinks that containalcohol. Energy drinks and sports drinks. Carbonated drinks or sodas. Chocolate and cocoa. Peppermint and mint flavorings. Garlic and onions. Horseradish. Spicy and acidic foods, including peppers, chili powder, curry powder, vinegar, hot sauces, and  barbecue sauce. Citrus fruit juices and citrus fruits, such as oranges, lemons, and limes. Tomato-based foods, such as red sauce, chili, salsa, and pizza with red sauce. Fried and fatty foods, such as donuts, french fries, potato chips, and high-fat dressings. High-fat meats, such as hot dogs and fatty cuts of red and white meats, such as rib eye steak, sausage, ham, and bacon. High-fat dairy items, such as whole milk, butter, and cream cheese. Eat small, frequent meals instead of large meals. Avoid drinking large amounts of liquid with your meals. Avoid eating meals during the 2-3 hours before bedtime. Avoid lying down right after you eat. Do not exercise right after you eat. Lifestyle  Do not use any products that contain nicotine or tobacco, such as cigarettes, e-cigarettes, and chewing tobacco. If you need help quitting, ask your health care provider. Try to reduce your stress by using methods such as yoga or meditation. If you need help reducing stress, ask your health care provider. If you are overweight, reduce your weight to an amount that is healthy for you. Ask your health care provider for guidance about a safe weight loss goal. General instructions Pay attention to any changes in your symptoms. Take over-the-counter and prescription medicines only as told by your health care provider. Do not take aspirin, ibuprofen, or other NSAIDs unless your health care provider told you to do so. Wear loose-fitting clothing. Do not wear anything tight around your waist that causes pressure on your abdomen. Raise (elevate) the head of your bed about 6 inches (15 cm). Avoid bending over if this makes your symptoms worse. Keep all follow-up visits as told by your health care provider. This is important. Contact a health care provider if: You have: New symptoms. Unexplained weight loss. Difficulty swallowing or it hurts to swallow. Wheezing or a persistent cough. A hoarse voice. Your symptoms  do not improve with treatment. Get help right away if you: Have pain in your arms, neck, jaw, teeth, or back. Feel sweaty, dizzy, or light-headed. Have chest pain or shortness of breath. Vomit and your vomit looks like blood or coffee grounds. Faint. Have stool that is bloody or black. Cannot swallow, drink, or eat. Summary Gastroesophageal reflux happens when acid from the stomach flows up into the esophagus. GERD is a disease in which the reflux happens often, causes frequent or severe symptoms, or causes problems such as damage to the esophagus. Treatment for this condition may vary depending on how severe your symptoms are. Your health care provider may recommend diet and lifestyle changes, medicine, or surgery. Contact a health care provider if you have new or worsening symptoms. Take over-the-counter and prescription medicines only as told by your health care provider. Do not take aspirin, ibuprofen, or other NSAIDs unless your health care provider told you to do so. Keep all follow-up visits  as told by your health care provider. This is important. This information is not intended to replace advice given to you by your health care provider. Make sure you discuss any questions you have with your health care provider. Document Revised: 04/07/2018 Document Reviewed: 04/07/2018 Elsevier Patient Education  2020 [employer].    Stop Pentasa, Reglan, Carafate and Protonix  Continue Amitiza  You have a hiatal hernia and reflux esophagitis.  Your colonoscopy was normal  Formation on GERD and hiatal hernia  Begin Dexilant 60 mg daily-prescription provided; samples will be available at the office tomorrow  Office visit with [place] in 4 weeks(EG)  At patient request, I called Dustin at [phone]-call rolled to voicemail

## 2020-02-13 NOTE — Op Note (Signed)
[facility] Patient Name: Samantha Russell Procedure Date: 02/13/2020 11:23 AM MRN: [id number] Date of Birth: [birth year] Attending MD: Gennette Pac , MD CSN: [id number] Age: 29 Admit Type: Outpatient Procedure:                Colonoscopy Indications:              Abnormal CT of the GI tract Providers:                Gennette Pac, MD, Jannett Celestine, RN, Edythe Clarity, Technician Referring MD:              Medicines:                Propofol per Anesthesia Complications:            No immediate complications. Estimated Blood Loss:     Estimated blood loss: none. Procedure:                Pre-Anesthesia Assessment:                           - Prior to the procedure, a History and Physical                            was performed, and patient medications and                            allergies were reviewed. The patient's tolerance of                            previous anesthesia was also reviewed. The risks                            and benefits of the procedure and the sedation                            options and risks were discussed with the patient.                            All questions were answered, and informed consent                            was obtained. Prior Anticoagulants: The patient has                            taken no previous anticoagulant or antiplatelet                            agents. ASA Grade Assessment: II - A patient with                            mild systemic disease. After reviewing the risks  and benefits, the patient was deemed in                            satisfactory condition to undergo the procedure.                           After obtaining informed consent, the colonoscope                            was passed under direct vision. Throughout the                            procedure, the patient's blood pressure, pulse, and                            oxygen saturations  were monitored continuously. The                            [id number]-[id number] ([id number]) scope was introduced through                            the anus and advanced to the 5 cm into the ileum.                            The colonoscopy was performed without difficulty.                            The patient tolerated the procedure well. The                            quality of the bowel preparation was adequate. Scope In: 11:30:29 AM Scope Out: 11:39:58 AM Scope Withdrawal Time: 0 hours 6 minutes 46 seconds  Total Procedure Duration: 0 hours 9 minutes 29 seconds  Findings:      The perianal and digital rectal examinations were normal aside from       small external hemorrhoid tags.      The colon (entire examined portion) appeared normal.      The retroflexed view of the distal rectum and anal verge was normal and       showed no anal or rectal abnormalities aside from mild hemorrhoids.       Distal 5 cm of terminal ileum appeared normal. Impression:               - The entire examined colon is normal. Normal                            terminal ileum.                           -Internal and external hemorrhoids.                           - No specimens collected. Patient is chronically  constipated. No evidence of inflammatory bowel                            disease. Moderate Sedation:      Moderate (conscious) sedation was personally administered by an       anesthesia professional. The following parameters were monitored: oxygen       saturation, heart rate, blood pressure, respiratory rate, EKG, adequacy       of pulmonary ventilation, and response to care. Recommendation:           - Patient has a contact number available for                            emergencies. The signs and symptoms of potential                            delayed complications were discussed with the                            patient. Return to normal activities tomorrow.                             Written discharge instructions were provided to the                            patient.                           - Advance diet as tolerated.                           - Continue present medications. Stop                            Bentyl/dicyclomine. Stop Pentasa. Continue Amitiza.                            See EGD report.                           -Screening colonoscopy age 89. Procedure Code(s):        --- Professional ---                           [phone], Colonoscopy, flexible; diagnostic, including                            collection of specimen(s) by brushing or washing,                            when performed (separate procedure) Diagnosis Code(s):        --- Professional ---                           R93.3, Abnormal findings on diagnostic imaging of  other parts of digestive tract CPT copyright 2019 American Medical Association. All rights reserved. The codes documented in this report are preliminary and upon coder review may  be revised to meet current compliance requirements. Gerrit Friends. Izamar Linden, MD Gennette Pac, MD 02/13/2020 11:46:52 AM This report has been signed electronically. Number of Addenda: 0

## 2020-02-13 NOTE — Op Note (Addendum)
[facility] Patient Name: Samantha Russell Procedure Date: 02/13/2020 11:00 AM MRN: [id number] Date of Birth: 08-04-1991 Attending MD: Gennette Pac , MD CSN: [id number] Age: 29 Admit Type: Outpatient Procedure:                Upper GI endoscopy Indications:              Epigastric abdominal pain Providers:                Gennette Pac, MD, Jannett Celestine, RN, Edythe Clarity, Technician Referring MD:             Kaleen Mask Medicines:                Propofol per Anesthesia Complications:            No immediate complications. Estimated Blood Loss:     Estimated blood loss: none. Procedure:                Pre-Anesthesia Assessment:                           - Prior to the procedure, a History and Physical                            was performed, and patient medications and                            allergies were reviewed. The patient's tolerance of                            previous anesthesia was also reviewed. The risks                            and benefits of the procedure and the sedation                            options and risks were discussed with the patient.                            All questions were answered, and informed consent                            was obtained. Prior Anticoagulants: The patient has                            taken no previous anticoagulant or antiplatelet                            agents. ASA Grade Assessment: II - A patient with                            mild systemic disease. After reviewing the risks  and benefits, the patient was deemed in                            satisfactory condition to undergo the procedure.                           After obtaining informed consent, the endoscope was                            passed under direct vision. Throughout the                            procedure, the patient's blood pressure, pulse, and                             oxygen saturations were monitored continuously. The                            GIF-[id number] ([id number]) was introduced through the                            mouth, and advanced to the second part of duodenum.                            The upper GI endoscopy was accomplished without                            difficulty. The patient tolerated the procedure                            well. Scope In: 11:19:11 AM Scope Out: 11:22:54 AM Total Procedure Duration: 0 hours 3 minutes 43 seconds  Findings:      A non-obstructing Schatzki ring was found at the gastroesophageal       junction. Couple small distal esophageal erosions. No Barrett's       epithelium.      A small hiatal hernia was present.      The exam was otherwise without abnormality.      The duodenal bulb and second portion of the duodenum were normal. Impression:               - Non-obstructing Schatzki ring. Not manipulated.                            No dysphagia. Mild erosive reflux esophagitis                           - Small hiatal hernia.                           - The examination was otherwise normal.                           - Normal duodenal bulb and second portion of the  duodenum.                           - No specimens collected. Moderate Sedation:      Moderate (conscious) sedation was personally administered by an       anesthesia professional. The following parameters were monitored: oxygen       saturation, heart rate, blood pressure, respiratory rate, EKG, adequacy       of pulmonary ventilation, and response to care. Recommendation:           - Patient has a contact number available for                            emergencies. The signs and symptoms of potential                            delayed complications were discussed with the                            patient. Return to normal activities tomorrow.                            Written discharge instructions were provided to the                             patient.                           - Advance diet as tolerated. Stop Protonix; stop                            Reglan and Carafate. Begin Dexilant 60 mg daily.                            Prescription and samples through the office. Office                            visit with [place] in 4 weeks. See colonoscopy report. Procedure Code(s):        --- Professional ---                           [id number], Esophagogastroduodenoscopy, flexible,                            transoral; diagnostic, including collection of                            specimen(s) by brushing or washing, when performed                            (separate procedure) Diagnosis Code(s):        --- Professional ---                           K22.2, Esophageal obstruction  K44.9, Diaphragmatic hernia without obstruction or                            gangrene                           R10.13, Epigastric pain CPT copyright 2019 American Medical Association. All rights reserved. The codes documented in this report are preliminary and upon coder review may  be revised to meet current compliance requirements. Cristopher Estimable. Shaunika Italiano, MD Norvel Richards, MD 02/13/2020 11:41:56 AM This report has been signed electronically. Number of Addenda: 0

## 2020-02-13 NOTE — Telephone Encounter (Signed)
Noted. Per RMR, I'm holding Dexilant samples for 3 weeks when samples arrive this week.

## 2020-02-13 NOTE — Transfer of Care (Signed)
Immediate Anesthesia Transfer of Care Note  Patient: Samantha Russell  Procedure(s) Performed: COLONOSCOPY WITH PROPOFOL (N/A ) ESOPHAGOGASTRODUODENOSCOPY (EGD) WITH PROPOFOL (N/A )  Patient Location: PACU  Anesthesia Type:General  Level of Consciousness: awake, alert , oriented and patient cooperative  Airway & Oxygen Therapy: Patient Spontanous Breathing and Patient connected to face mask oxygen  Post-op Assessment: Report given to RN, Post -op Vital signs reviewed and stable and Patient moving all extremities X 4  Post vital signs: Reviewed and stable  Last Vitals:  Vitals Value Taken Time  BP 110/53 02/13/20 1148  Temp    Pulse 86 02/13/20 1155  Resp 14 02/13/20 1155  SpO2 100 % 02/13/20 1155  Vitals shown include unvalidated device data.  Last Pain: There were no vitals filed for this visit.       Complications: No apparent anesthesia complications

## 2020-02-16 ENCOUNTER — Telehealth: Payer: Self-pay

## 2020-02-16 NOTE — Telephone Encounter (Signed)
Dexilant Samples are ready for pickup. Per RMR, he wants pt take Dexilant 60 mg daily. Call back with progress report. Left a detailed message for pt. Pt is aware that our office closes early on Fridays.

## 2020-03-01 NOTE — Telephone Encounter (Signed)
Left a detailed message for pt. The samples RMR wanted pt to try [facility]) are ready for pickup. Pt hasn't picked up medication s/p her procedure.

## 2020-03-07 ENCOUNTER — Ambulatory Visit: Payer: Medicaid Other | Admitting: Nurse Practitioner

## 2020-03-07 ENCOUNTER — Encounter: Payer: Self-pay | Admitting: Internal Medicine

## 2020-03-07 NOTE — Progress Notes (Deleted)
Referring Provider: Leonard Downing, * Primary Care Physician:  Leonard Downing, MD Primary GI:  Dr. Gala Romney  No chief complaint on file.   HPI:   Samantha Russell is a 29 y.o. female who presents for follow-up on constipation/abdominal pain.  The patient was last seen in our office 01/24/2020 for generalized abdominal pain, constipation, GERD, non-intractable nausea and vomiting.  Noted history of positive hepatitis C, chronic constipation with trial and failure of Linzess due to diarrhea even at low-dose.  History of GERD on Protonix daily.  Without medication, has a bowel movement every 4 to 5 days.  Previous EGD in [place], [place] with erosions for which she was started on Nexium.  Intermittent NSAIDs historically.  Positive hepatitis C antibody had unclear RNA testing and felt likely hepatitis C with spontaneous clearance.  Previously did well on Amitiza 24 mcg twice daily.  Follow-up hep C RNA negative.  CT of the abdomen and pelvis in December 2020 found negative study with no acute findings and specifically the stomach and bowel without evidence of obstruction, inflammatory process, or abnormal fluid collections.  ER visit 11/18/2019 for abdominal pain that was progressively worsening nonbloody with unremarkable labs.  CT found suggestion of wall thickening involving the splenic flexure of the colon and ascending colon which may be secondary to underdistention versus infectious or inflammatory colitis.  This is new compared to previous CT.  Felt possible inflammatory bowel disease with her history of dark stools and chronic abdominal pain and started on antibiotics and mesalamine, referred to GI.  Follow-up call to our office with some worsening complaints, subjective fever, noted daughter recently with viral gastroenteritis which she felt was likely affecting her.  Recommended clear liquids and follow-up if no improvement in 48 to 72 hours.  ER precautions for severe  symptoms.  Follow-up phone call to her somewhat better, Zofran helps with nausea and bowels are doing okay, no fever.  Preferred to monitor and office visit was canceled.  She called our office today of her last appointment noting abdominal pain, vomiting and was offered a same-day appointment.  At her last visit persistent abdominal pain, constant and 3/10 but worsens if she stands, sits, walks.  Pain gets "really bad, to where I am crying" about a 10/10 at times.  Also with nausea and vomiting with last episode of emesis that morning.  Urgent care gave her Bentyl which has not helped.  Pain is all over her abdomen, burning to stabbing.  No unintentional weight loss.  Stools variable in consistency even within the same bowel movement.  Chronic GERD somewhat worse than normal and she feels the only thing that helps her is "not eating".  No longer taking NSAIDs or aspirin powders.  Recommended increase Protonix to twice daily, call for worsening GERD symptoms, stop dicyclomine, continue Amitiza 24 mcg twice daily for constipation, referral to the emergency department due to progressively worsening symptoms and abnormal CT, follow-up in 6 to 8 weeks.  The patient was seen in the emergency department and urinalysis, CBC, lipase, CMP all essentially normal.  CT of the abdomen and pelvis found small right adnexal cyst likely ovarian in origin.  Noted no apparent distress, nontoxic appearing.  Given the relatively small size of her cysts low suspicion for ovarian torsion.  She was tolerating oral intake in the emergency department and she was discharged home with supportive measures, fiber supplement, probiotic, Bentyl for pain.  Follow-up with primary care and GI.  She called  our office a week later requesting short-term disability paperwork, unable to eat much only eating a couple bites that come back up.  Continuing medications as recommended.  She decided to go back to the ER on 02/02/2020.  ED visit reviewed  02/02/2020 for abdominal pain, seen multiple times for similar complaints as well as worsening nausea and vomiting unable to keep down liquids at home, using Zofran and Phenergan without relief.  Pain right upper quadrant.  CT scan less than 2 weeks ago with small ovarian cyst and no acute findings.  No tenderness to her right lower quadrant.  Tender to epigastric and right upper quadrant.  Appears well-hydrated.  Plan on ultrasound right upper quadrant.  Noted to have hypokalemia with potassium of 2.9 and recommended replacement.  Right upper quadrant ultrasound found no definite abnormality in the right upper quadrant of the abdomen.  On reevaluation her pain was improved, emesis resolved with Reglan.  Recommended start Carafate, switch Zofran/Phenergan to Reglan, close follow-up with GI.  She called our office 1 week later with more constant right upper quadrant pain and nausea, acid regurgitation.  Limited intake.  Scheduled for EGD/TCS on 02/13/2020.  EGD completed 02/13/2020 which found nonobstructing Schatzki's ring, mild erosive reflux esophagitis, small hiatal hernia, otherwise normal.  Recommended stop Protonix, Reglan, Carafate.  Begin Dexilant 60 mg daily and follow-up visit in 4 weeks.  Colonoscopy the same day found entire examined colon normal, normal TI, internal and external hemorrhoids.  Recommended continue current medications.  Stop Bentyl/dicyclomine, stop Pentasa, continue Amitiza.  Repeat screening colonoscopy at age 34.  Today she states   Past Medical History:  Diagnosis Date  . Asthma   . Bipolar 1 disorder (HCC)   . Multiple gastric ulcers    in [place], [place]  . Stomach ulcer     Past Surgical History:  Procedure Laterality Date  . COLONOSCOPY WITH PROPOFOL N/A 02/13/2020   Procedure: COLONOSCOPY WITH PROPOFOL;  Surgeon: Corbin Ade, MD;  Location: [facility];  Service: Endoscopy;  Laterality: N/A;  10:30  . DILATION AND CURETTAGE OF UTERUS    .  ESOPHAGOGASTRODUODENOSCOPY  2013   [place], [place]  . ESOPHAGOGASTRODUODENOSCOPY (EGD) WITH PROPOFOL N/A 02/13/2020   Procedure: ESOPHAGOGASTRODUODENOSCOPY (EGD) WITH PROPOFOL;  Surgeon: Corbin Ade, MD;  Location: [facility];  Service: Endoscopy;  Laterality: N/A;    Current Outpatient Medications  Medication Sig Dispense Refill  . albuterol (VENTOLIN HFA) 108 (90 Base) MCG/ACT inhaler Inhale 2 puffs into the lungs every 6 (six) hours as needed for wheezing or shortness of breath. 1 Inhaler 1  . AMITIZA 24 MCG capsule TAKE 1 CAPSULE (24 MCG TOTAL) BY MOUTH 2 (TWO) TIMES DAILY WITH A MEAL. (Patient taking differently: Take 24 mcg by mouth 2 (two) times daily with a meal. ) 60 capsule 3  . diphenhydrAMINE (BENADRYL) 25 MG tablet Take 25 mg by mouth every 6 (six) hours as needed for allergies.     . fluticasone (FLONASE) 50 MCG/ACT nasal spray Take 1-2 sprays daily (Patient taking differently: Place 2 sprays into both nostrils daily. ) 16 g 5  . fluticasone (FLOVENT HFA) 110 MCG/ACT inhaler Inhale 2 puffs into the lungs 2 (two) times a day. 1 Inhaler 5  . lamoTRIgine (LAMICTAL) 100 MG tablet Take 100 mg by mouth 2 (two) times daily.     . ondansetron (ZOFRAN) 4 MG tablet Take 1 tablet (4 mg total) by mouth every 8 (eight) hours as needed for nausea or vomiting.  30 tablet 1  . sertraline (ZOLOFT) 25 MG tablet Take 50 mg by mouth daily.      No current facility-administered medications for this visit.    Allergies as of 03/07/2020 - Review Complete 02/13/2020  Allergen Reaction Noted  . Penicillins Other (See Comments) 06/24/2015    Family History  Problem Relation Age of Onset  . Anxiety disorder Mother   . Depression Mother   . Heart disease Father   . Diabetes Maternal Grandmother   . Diabetes Paternal Grandmother   . Colon cancer Neg Hx     Social History   Socioeconomic History  . Marital status: Married    Spouse name: Not on file  . Number of children: Not on file  .  Years of education: Not on file  . Highest education level: Not on file  Occupational History  . Not on file  Tobacco Use  . Smoking status: Current Every Day Smoker    Packs/day: 0.50    Years: 10.00    Pack years: 5.00    Types: Cigarettes  . Smokeless tobacco: Never Used  Substance and Sexual Activity  . Alcohol use: Yes    Comment: rare  . Drug use: No  . Sexual activity: Yes    Birth control/protection: None  Other Topics Concern  . Not on file  Social History Narrative  . Not on file   Social Determinants of Health   Financial Resource Strain:   . Difficulty of Paying Living Expenses:   Food Insecurity:   . Worried About Programme researcher, broadcasting/film/video in the Last Year:   . Barista in the Last Year:   Transportation Needs:   . Freight forwarder (Medical):   Marland Kitchen Lack of Transportation (Non-Medical):   Physical Activity:   . Days of Exercise per Week:   . Minutes of Exercise per Session:   Stress:   . Feeling of Stress :   Social Connections:   . Frequency of Communication with Friends and Family:   . Frequency of Social Gatherings with Friends and Family:   . Attends Religious Services:   . Active Member of Clubs or Organizations:   . Attends Banker Meetings:   Marland Kitchen Marital Status:     Subjective: Review of Systems  Constitutional: Negative for chills, fever, malaise/fatigue and weight loss.  HENT: Negative for congestion and sore throat.   Respiratory: Negative for cough and shortness of breath.   Cardiovascular: Negative for chest pain and palpitations.  Gastrointestinal: Negative for abdominal pain, blood in stool, diarrhea, melena, nausea and vomiting.  Musculoskeletal: Negative for joint pain and myalgias.  Skin: Negative for rash.  Neurological: Negative for dizziness and weakness.  Endo/Heme/Allergies: Does not bruise/bleed easily.  Psychiatric/Behavioral: Negative for depression. The patient is not nervous/anxious.   All other systems  reviewed and are negative.    Objective: There were no vitals taken for this visit. Physical Exam Vitals and nursing note reviewed.  Constitutional:      General: She is not in acute distress.    Appearance: Normal appearance. She is well-developed. She is not ill-appearing, toxic-appearing or diaphoretic.  HENT:     Head: Normocephalic and atraumatic.     Nose: No congestion or rhinorrhea.  Eyes:     General: No scleral icterus. Cardiovascular:     Rate and Rhythm: Normal rate and regular rhythm.     Heart sounds: Normal heart sounds.  Pulmonary:     Effort: Pulmonary  effort is normal. No respiratory distress.     Breath sounds: Normal breath sounds.  Abdominal:     General: Bowel sounds are normal.     Palpations: Abdomen is soft. There is no hepatomegaly, splenomegaly or mass.     Tenderness: There is no abdominal tenderness. There is no guarding or rebound.     Hernia: No hernia is present.  Skin:    General: Skin is warm and dry.     Coloration: Skin is not jaundiced.     Findings: No rash.  Neurological:     General: No focal deficit present.     Mental Status: She is alert and oriented to person, place, and time.  Psychiatric:        Attention and Perception: Attention normal.        Mood and Affect: Mood normal.        Speech: Speech normal.        Behavior: Behavior normal.        Thought Content: Thought content normal.        Cognition and Memory: Cognition and memory normal.       03/07/2020 1:42 PM   Disclaimer: This note was dictated with voice recognition software. Similar sounding words can inadvertently be transcribed and may not be corrected upon review.

## 2020-03-13 ENCOUNTER — Telehealth: Payer: Self-pay | Admitting: Emergency Medicine

## 2020-03-13 NOTE — Telephone Encounter (Signed)
As of 6/1 pt has not came and gotten samples of dexilant. Placed back in closet and given to another pt

## 2020-05-23 ENCOUNTER — Ambulatory Visit: Payer: Medicaid Other | Admitting: Nurse Practitioner

## 2021-06-19 ENCOUNTER — Telehealth: Payer: Self-pay | Admitting: Gastroenterology

## 2021-06-19 ENCOUNTER — Telehealth: Payer: Self-pay | Admitting: Internal Medicine

## 2021-06-19 NOTE — Telephone Encounter (Signed)
Lmom for pt to return my call.  

## 2021-06-19 NOTE — Telephone Encounter (Signed)
Pt was made aware and was put on the schedule with Lewie Loron on 06/20/21.

## 2021-06-19 NOTE — Telephone Encounter (Signed)
See other note

## 2021-06-19 NOTE — Telephone Encounter (Signed)
Pt called stating that she was recently seen at [facility] for colitis a little over a week ago. Pt is unable to eat or keep anything down. Pt states that there is mucous in her stools and that she is having upper and lower abdominal pain. She notices the pain and nausea is worse when she sits straight up.

## 2021-06-19 NOTE — Telephone Encounter (Signed)
We haven't seen her since April/May 2021. I do not have any access to [facility]'s system to review. If she is unable to drink liquids, she needs to go to the ED. Unfortunately, I can't offer anything as outpatient. However, if she is tolerating liquids, please see if we have any openings to be seen this week.

## 2021-06-19 NOTE — Progress Notes (Addendum)
Referring Provider: Leonard Downing, * Primary Care Physician:  Leonard Downing, MD Primary GI: Dr. Gala Romney   Chief Complaint  Patient presents with   Abdominal Pain    Mid upper abd and lower abd   Nausea    And vomiting   mucous in stool     HPI:   Samantha Russell is a 30 y.o. female presenting today with a history of abdominal pain, constipation, positive Hep C antibody but negative RNA felt to be due to spontaneous clearance, and GERD. Colonoscopy on file May 2021 normal. EGD as well May 2021 with non-obstructing Schatzki ring, not manipulated. Mild erosive reflux esophagitis, small hiatal hernia. Returns today in urgent visit due to reports of colitis at outside facility, abdominal pain, N/V, and mucus in stool.    Was inpatient at [facility] with dehydration. Had been dealing with epigastric pain, N/V since July. Worsening symptoms. Starts to get sick prior to her menstrual cycle, which worsens everything. Was vomiting excessively. Gets in a bath when doesn't feel good but feels like her body temp is off. Passed out in bathtub a few times. Feels crampy and full. Has had little to no appetite. Kept eggs down a few days ago but vomited a bunch of liquid. Able to eat taco dip a few days ago. Afraid to eat. Keeping liquids down now. Having epigastric pain. Also lower abdominal pain but just started her period. Epigastric pain comes and goes. Scared to eat. No appetite. States if she eats, she will probably vomit. Able to just now handle sprite. Has good days and bad days. Good weeks and bad weeks. Calling OB/GYN due to menstrual cycles and pain, when symptoms worsen.   Last 2 BMs smaller particles and mucus. Day prior to this dark and brown. When wiping is orange. Was having diarrhea when going into the hospital. Was told she had colitis. No stool studies. Was given an antibiotic while at hospital. Felt so much better after IV fluids. Sent home after IV fluids. Doesn't take  Amitiza routinely. Has been off a PPI for awhile. Has dissolvable Zofran left, few. Rare NSAIDs.   Almond milk and milk flares up problems.   No marijuana. Uses Delta 8 for anxiety.    Past Medical History:  Diagnosis Date   Asthma    Bipolar 1 disorder ([place])    Multiple gastric ulcers    in [place], [place]   Stomach ulcer     Past Surgical History:  Procedure Laterality Date   COLONOSCOPY WITH PROPOFOL N/A 02/13/2020   normal colon, normal TI, internal and external hemorrhoids   DILATION AND CURETTAGE OF UTERUS     ESOPHAGOGASTRODUODENOSCOPY  2013   [place], [place]   ESOPHAGOGASTRODUODENOSCOPY (EGD) WITH PROPOFOL N/A 02/13/2020   non-obstructing Schatzki ring, not manipulated. Mild erosive reflux esophagitis, small hiatal hernia    Current Outpatient Medications  Medication Sig Dispense Refill   albuterol (VENTOLIN HFA) 108 (90 Base) MCG/ACT inhaler Inhale 2 puffs into the lungs every 6 (six) hours as needed for wheezing or shortness of breath. 1 Inhaler 1   AMITIZA 24 MCG capsule TAKE 1 CAPSULE (24 MCG TOTAL) BY MOUTH 2 (TWO) TIMES DAILY WITH A MEAL. (Patient taking differently: Take 24 mcg by mouth 2 (two) times daily with a meal. ) 60 capsule 3   diphenhydrAMINE (BENADRYL) 25 MG tablet Take 25 mg by mouth every 6 (six) hours as needed for allergies.      fluticasone (FLONASE) 50  MCG/ACT nasal spray Take 1-2 sprays daily (Patient taking differently: Place 2 sprays into both nostrils daily. ) 16 g 5   fluticasone (FLOVENT HFA) 110 MCG/ACT inhaler Inhale 2 puffs into the lungs 2 (two) times a day. 1 Inhaler 5   lamoTRIgine (LAMICTAL) 100 MG tablet Take 100 mg by mouth 2 (two) times daily.      ondansetron (ZOFRAN) 4 MG tablet Take 1 tablet (4 mg total) by mouth every 8 (eight) hours as needed for nausea or vomiting. 30 tablet 1   sertraline (ZOLOFT) 25 MG tablet Take 50 mg by mouth daily.      No current facility-administered medications for this visit.    Allergies as of  06/20/2021 - Review Complete 06/20/2021  Allergen Reaction Noted   Penicillins Other (See Comments) 06/24/2015    Family History  Problem Relation Age of Onset   Anxiety disorder Mother    Depression Mother    Heart disease Father    Diabetes Maternal Grandmother    Diabetes Paternal Grandmother    Colon cancer Neg Hx     Social History   Socioeconomic History   Marital status: Married    Spouse name: Not on file   Number of children: Not on file   Years of education: Not on file   Highest education level: Not on file  Occupational History   Not on file  Tobacco Use   Smoking status: Every Day    Packs/day: 0.50    Years: 10.00    Pack years: 5.00    Types: Cigarettes   Smokeless tobacco: Never  Vaping Use   Vaping Use: Never used  Substance and Sexual Activity   Alcohol use: Yes    Comment: rare   Drug use: No   Sexual activity: Yes    Birth control/protection: None  Other Topics Concern   Not on file  Social History Narrative   Not on file   Social Determinants of Health   Financial Resource Strain: Not on file  Food Insecurity: Not on file  Transportation Needs: Not on file  Physical Activity: Not on file  Stress: Not on file  Social Connections: Not on file    Review of Systems: See HPI  Physical Exam: BP 116/81   Pulse 87   Temp 97.7 F (36.5 C) (Temporal)   Ht 5' 4" (1.626 m)   Wt 175 lb 12.8 oz (79.7 kg)   LMP 06/19/2021   BMI 30.18 kg/m  General:   Alert and oriented. No distress noted. Pleasant and cooperative.  Head:  Normocephalic and atraumatic. Eyes:  Conjuctiva clear without scleral icterus. Mouth:  mask in place Abdomen:  +BS, soft, non-tender and non-distended. No rebound or guarding. No HSM or masses noted. Msk:  Symmetrical without gross deformities. Normal posture. Extremities:  Without edema. Neurologic:  Alert and  oriented x4 Psych:  Alert and cooperative. Normal mood and affect.  ASSESSMENT: Samantha Russell is a 30  y.o. female presenting today  with a history of abdominal pain, constipation, positive Hep C antibody but negative RNA felt to be due to spontaneous clearance, and GERD. Colonoscopy on file May 2021 normal. EGD as well May 2021 with non-obstructing Schatzki ring, not manipulated. Mild erosive reflux esophagitis, small hiatal hernia. Returns today in urgent visit due to reports of colitis at outside facility, abdominal pain, N/V, and mucus in stool.    I do not have records at this time. Presentation seems most consistent with an acute gastroenteritis. Slowly   improving and starting to advance diet.   Lower abdominal pain is chronic and occurs with menstrual cycle intermittently; she will be seeing GYN regarding this.  For now, I will obtain outside labs and CT from [place]. As diarrhea has resolved, holding off on stool studies.  Will increase omeprazole to BID for now. Zofran scheduled TID before meals. Carafate for short term before meals and at bedtime.   Further recommendations after CT obtained and labs.   Will see in 4 months regardless.     W. , PhD, ANP-BC [facility]     ADDENDUM: received labs dated 06/06/21. Hgb 13.4, Hct 39.5, WBC count 7, Tbili 0.40, AST 25, ALT 17, Alk Phos 86, creatinine 0.6, lipase mildly elevated at 138. CT with mildly motion limited, non-contrasted study. Questionable mild circumferential thickening of ascending colon, possibly segmental colitis vs inflammatory, ischemic, infectious. 3 mm left lower lobe nodule.   

## 2021-06-19 NOTE — Telephone Encounter (Signed)
Pt wants to speak with nurse. [phone]

## 2021-06-19 NOTE — Telephone Encounter (Signed)
I need all records from hospitalization in [place], so I can review this prior to her appointment tomorrow. Thank you! Specifically any imaging and labs!

## 2021-06-20 ENCOUNTER — Ambulatory Visit: Payer: Medicaid Other | Admitting: Gastroenterology

## 2021-06-20 ENCOUNTER — Other Ambulatory Visit: Payer: Self-pay

## 2021-06-20 ENCOUNTER — Encounter: Payer: Self-pay | Admitting: Gastroenterology

## 2021-06-20 VITALS — BP 116/81 | HR 87 | Temp 97.7°F | Ht 64.0 in | Wt 175.8 lb

## 2021-06-20 DIAGNOSIS — R112 Nausea with vomiting, unspecified: Secondary | ICD-10-CM

## 2021-06-20 DIAGNOSIS — R1013 Epigastric pain: Secondary | ICD-10-CM

## 2021-06-20 MED ORDER — SUCRALFATE 1 GM/10ML PO SUSP: 1.0000 g | Freq: Four times a day (QID) | ORAL | 1 refills | Status: DC

## 2021-06-20 MED ORDER — ONDANSETRON 4 MG PO TBDP: 4.0000 mg | ORAL_TABLET | Freq: Three times a day (TID) | ORAL | 3 refills | Status: DC

## 2021-06-20 MED ORDER — OMEPRAZOLE 20 MG PO CPDR: 20.0000 mg | DELAYED_RELEASE_CAPSULE | Freq: Two times a day (BID) | ORAL | 5 refills | Status: DC

## 2021-06-20 NOTE — Patient Instructions (Signed)
I am requesting labs and CT from [place].  I have sent in omeprazole to take twice a day on an empty stomach, at least 30 minutes before eating.   I have also sent in refills of Zofran to take three times a day before meals. We are trying to stay on top of the nausea so you can start advancing your diet.  I sent in carafate suspension to help coat your stomach. You can take this four times a day (before meals and at bedtime).  Please call if no improvement!  We will see you in 4 months!  It was a pleasure to see you today. I want to create trusting relationships with patients to provide genuine, compassionate, and quality care. I value your feedback. If you receive a survey regarding your visit,  I greatly appreciate you taking time to fill this out.   Gelene Mink, PhD, ANP-BC [facility]

## 2021-06-26 ENCOUNTER — Other Ambulatory Visit: Payer: Self-pay | Admitting: Gastroenterology

## 2021-06-26 DIAGNOSIS — Z8619 Personal history of other infectious and parasitic diseases: Secondary | ICD-10-CM

## 2021-06-26 DIAGNOSIS — R140 Abdominal distension (gaseous): Secondary | ICD-10-CM

## 2021-06-26 DIAGNOSIS — K219 Gastro-esophageal reflux disease without esophagitis: Secondary | ICD-10-CM

## 2021-06-26 DIAGNOSIS — K5900 Constipation, unspecified: Secondary | ICD-10-CM

## 2021-07-16 ENCOUNTER — Encounter: Payer: Self-pay | Admitting: Internal Medicine

## 2021-07-17 ENCOUNTER — Telehealth: Payer: Self-pay | Admitting: Gastroenterology

## 2021-07-17 NOTE — Telephone Encounter (Signed)
Phoned the pt's home number and it has been disconnected. Call the cell phone listed and there was a little child on the phone and I couldn't get through to the mother. Not sure if it's the pt phone. Will contact her by MyChart

## 2021-07-17 NOTE — Telephone Encounter (Signed)
    Dena: Please let patient know I received labs and CT. I hope her symptoms have improved. CT with possible thickening of ascending colon. If she is having diarrhea, I recommend stool studies. Hopefully, she has improved. Keep upcoming appointment in November.

## 2021-07-18 NOTE — Telephone Encounter (Signed)
Phoned and LMOVM for the pt to return call 

## 2021-07-22 NOTE — Telephone Encounter (Signed)
LMOVM for the pt to return the call

## 2021-07-23 NOTE — Telephone Encounter (Signed)
Letter mailed to the pt. 

## 2021-08-06 NOTE — Telephone Encounter (Signed)
Can we get reports from [place], please? Any imaging done? No stool studies necessary unless having diarrhea.

## 2021-08-06 NOTE — Telephone Encounter (Signed)
Pt returned call she now longer has diarrhea but since her CT scan she states she has been in the ER/hospital in [place]. States her PCP agrees with stool studies being done. She is having a lot of issues with a hernia she has known about. Pt states Zofran helps with nausea sometimes (states she barely eats a few times a week). Pt can be reached at this number [phone] temporary number.

## 2021-08-06 NOTE — Telephone Encounter (Signed)
[place] records given to [employer]

## 2021-08-06 NOTE — Telephone Encounter (Signed)
Spoke with the pt , she is not having diarrhea anymore it is more her hernia. Her PCP is going to get some things done with her but she hasn't been told what. She states  she is having more issues with nausea and that is what is keeping her from eating.

## 2021-08-09 NOTE — Telephone Encounter (Signed)
Labs reviewed from 07/25/21 in [place] and will be scanned. Potassim 2.7, BUN 11, creatinine 0.7, Tbili 0.10, AST 24, ALT 14, lipase 39.  Hgb 15.5, Hct 45.5.   Her reports were of N/V and epigastric pain. No imaging completed.    Her LFTs are normal. She would benefit from an EGD. I see she has an appt upcoming with Dr. Jena Gauss in a few weeks.   Are symptoms improving? Make sure taking BID PPI and may take Zofran as needed. Does she have any right-sided abdominal pain? If so, we may need to consider updating RUQ [place] in interim. '

## 2021-08-09 NOTE — Telephone Encounter (Signed)
Phoned and LMOVM for pt to return call 

## 2021-08-13 ENCOUNTER — Ambulatory Visit: Payer: Medicaid Other | Admitting: Internal Medicine

## 2021-08-13 NOTE — Telephone Encounter (Signed)
Phoned and LMOVM for the pt to return call 

## 2021-08-13 NOTE — Telephone Encounter (Signed)
Pt returned call and was advised you received her records and the report was of n/v and epigastric pain. The pt did state "no images were taken then". Also advised of her benefiting from EGD. Pt states she is still having n/v. Pt is taking her medications as directed even up to 6 zofran a day. She states that 90% of the time she is vomiting. She picks and choose what to eat. She mostly drinks shakes with oatmeal or some sort of added vitamin or nutrient added. She states when she has discomfort it both sides (right and left) and the middle part of her abdomen area. (There is no specific spot that hurts). The pt is choosing not to eat at this point only drinks shakes the majority of the time.

## 2021-08-19 NOTE — Telephone Encounter (Signed)
Phoned and LMOVM for the pt to return call 

## 2021-08-19 NOTE — Telephone Encounter (Signed)
Do we have any openings where we can have patient seen THIS week, any available? We need to arrange EGD. We need to make sure all is stable before arranging it, but that it was I advise at this time. May also need to update RUQ [place] in interim to rule out biliary etiology.

## 2021-08-19 NOTE — Telephone Encounter (Signed)
[facility] clinical pool: please arrange RUQ [place] to rule out biliary etiology to symptoms. Dx: N/V, abdominal pain.

## 2021-08-21 ENCOUNTER — Telehealth: Payer: Self-pay

## 2021-08-21 NOTE — Telephone Encounter (Signed)
Opened in error

## 2021-08-21 NOTE — Telephone Encounter (Signed)
Phoned and LMOVM of the temporary phone to call us.

## 2021-08-23 NOTE — Telephone Encounter (Signed)
Pt will be in office on 08/27/2021

## 2021-08-26 ENCOUNTER — Telehealth: Payer: Self-pay

## 2021-08-26 NOTE — Telephone Encounter (Signed)
Pt called needing to reschedule her appointment that is scheduled for 08/27/2021. Please call patient to reschedule appointment.

## 2021-08-27 ENCOUNTER — Ambulatory Visit: Payer: Self-pay | Admitting: Internal Medicine

## 2021-09-09 NOTE — Progress Notes (Signed)
Referring Provider: Leonard Downing, * Primary Care Physician:  Leonard Downing, MD Primary GI Physician: Dr. Gala Romney  Chief Complaint  Patient presents with   Emesis    foamy   abdominal tenderness    HPI:   Samantha Russell is a 30 y.o. 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 Colonoscopy on file May 2021 normal. EGD as well May 2021 with non-obstructing Schatzki ring, not manipulated. Mild erosive reflux esophagitis, small hiatal hernia. She is presenting today for follow-up of nausea/vomiting and abdominal pain.   Last seen in our office 06/20/2021.  She had been inpatient at [facility] with dehydration in the setting of epigastric pain, nausea/vomiting since July.  She was able to keep liquids down at the time of her office visit.  Noted worsening of symptoms with menstrual cycle and milk.  Also reported diarrhea when she went to the hospital and was told she had colitis.  No stool studies.  Her last 2 bowel movements were smaller particles and mucus.  She had been off of PPI for a while.  Not taking Amitiza routinely.  Planned to obtain outside labs and CT from [place].  Hold off on stool studies as diarrhea had resolved.  Increase omeprazole to twice daily, Zofran 3 times daily before meals, Carafate short-term before meals and at bedtime.  CT received and reviewed.  Findings vs impression varied- Possible thickening of descending vs ascending colon.  Patient reported she was no longer having diarrhea.  Reported she has been seen again in [place] ER due to ongoing nausea in October.  Requested records.  Labs reviewed, CBC, LFTs, and lipase normal, no imaging completed.  Stated she would benefit from an EGD.  She was scheduled for an office visit on 11/15 with Dr. Gala Romney.  Patient canceled her appointment 11/15.  Today: Nausea/vomiting daily for the last month.  Symptoms initially flared up about 4-6 months. Blood sugars fluctuating. Was told her blood sugars have been high, no diagnosis of diabetes. Trouble with her K due to the vomiting. Scared to eat solid foods.If she blends her foods, she doesn't have any worsening abdominal pain or vomiting, but continues with nausea.  Usually, she vomits clear liquid or foam, no hematemesis.  May vomit 2 to 3 days a week.  Stress will also cause her to vomit.  She always has upper abdominal pain, but can have pain all over at times.  Feels her upper abdomen gets swollen at times.  She has trouble describing her pain to me.  States it can be every type of pain.  Some improvement with hot baths.  Breakthrough GERD symptoms 2-3 times a week on omeprazole 20 mg twice daily.  No dysphagia.  Uses Zofran daily to help with nausea.  Stools are moving twice a day with Amitiza BID. No brbpr or melena. Straining at times. Some lower abdominal pain at times. Menstrual cycle influences her lower abdominal pain and other GI symptoms in general.   No weight loss.   NSAIDs: None.  Rare alcohol.   Past Medical History:  Diagnosis Date   Asthma    Bipolar 1 disorder ([place])    Constipation    GERD (gastroesophageal reflux disease)    Multiple gastric ulcers    in [place], [place]   Stomach ulcer     Past Surgical History:  Procedure Laterality Date   COLONOSCOPY WITH PROPOFOL  N/A 02/13/2020   normal colon, normal TI, internal and external hemorrhoids   DILATION AND CURETTAGE OF UTERUS     ESOPHAGOGASTRODUODENOSCOPY  2013   [place], [place]   ESOPHAGOGASTRODUODENOSCOPY (EGD) WITH PROPOFOL N/A 02/13/2020   non-obstructing Schatzki ring, not manipulated. Mild erosive reflux esophagitis, small hiatal hernia    Current Outpatient Medications  Medication Sig Dispense Refill   albuterol (VENTOLIN HFA) 108 (90 Base) MCG/ACT inhaler Inhale 2 puffs into the lungs every 6 (six) hours as needed for wheezing or shortness of breath. 1 Inhaler  1   AMITIZA 24 MCG capsule TAKE 1 CAPSULE (24 MCG TOTAL) BY MOUTH 2 (TWO) TIMES DAILY WITH A MEAL. (Patient taking differently: 24 mcg 2 (two) times daily with a meal.) 60 capsule 11   diphenhydrAMINE (BENADRYL) 25 MG tablet Take 25 mg by mouth every 6 (six) hours as needed for allergies.      fluticasone (FLONASE) 50 MCG/ACT nasal spray Take 1-2 sprays daily (Patient taking differently: Place 2 sprays into both nostrils daily as needed for allergies.) 16 g 5   fluticasone (FLOVENT HFA) 110 MCG/ACT inhaler Inhale 2 puffs into the lungs 2 (two) times a day. 1 Inhaler 5   lamoTRIgine (LAMICTAL) 100 MG tablet Take 100 mg by mouth 2 (two) times daily.     omeprazole (PRILOSEC) 40 MG capsule Take 1 capsule (40 mg total) by mouth 2 (two) times daily before a meal. 60 capsule 3   ondansetron (ZOFRAN ODT) 4 MG disintegrating tablet Take 1 tablet (4 mg total) by mouth 3 (three) times daily before meals. (Patient taking differently: Take 8 mg by mouth 3 (three) times daily before meals.) 90 tablet 3   QUEtiapine (SEROQUEL) 100 MG tablet Take 100-200 mg by mouth See admin instructions. 100-150 mg twice daily, and 200 mg at bedtime     sertraline (ZOLOFT) 100 MG tablet Take 100 mg by mouth in the morning and at bedtime.     sucralfate (CARAFATE) 1 GM/10ML suspension Take 10 mLs (1 g total) by mouth 4 (four) times daily. 420 mL 1   ondansetron (ZOFRAN-ODT) 8 MG disintegrating tablet Take 8 mg by mouth 2 (two) times daily.     No current facility-administered medications for this visit.    Allergies as of 09/11/2021 - Review Complete 09/11/2021  Allergen Reaction Noted   Penicillins Other (See Comments) 06/24/2015    Family History  Problem Relation Age of Onset   Anxiety disorder Mother    Depression Mother    Heart disease Father    Diabetes Maternal Grandmother    Diabetes Paternal Grandmother    Colon cancer Neg Hx     Social History   Socioeconomic History   Marital status: Married     Spouse name: Not on file   Number of children: Not on file   Years of education: Not on file   Highest education level: Not on file  Occupational History   Not on file  Tobacco Use   Smoking status: Every Day    Packs/day: 0.50    Years: 10.00    Pack years: 5.00    Types: Cigarettes   Smokeless tobacco: Never  Vaping Use   Vaping Use: Never used  Substance and Sexual Activity   Alcohol use: Yes    Comment: rare   Drug use: No   Sexual activity: Yes    Birth control/protection: None  Other Topics Concern   Not on file  Social History Narrative   Not on  file   Social Determinants of Health   Financial Resource Strain: Not on file  Food Insecurity: Not on file  Transportation Needs: Not on file  Physical Activity: Not on file  Stress: Not on file  Social Connections: Not on file    Review of Systems: Gen: Denies fever, chills, cold or flulike symptoms, presyncope, syncope. CV: Denies chest pain, palpitations Resp: Denies dyspnea or cough. GI: See HPI Heme: See HPI  Physical Exam: BP 106/72   Pulse (!) 119   Temp (!) 97.3 F (36.3 C) (Temporal)   Ht 5\' 3"  (1.6 m)   Wt 179 lb (81.2 kg)   LMP 07/17/2021 (Approximate)   BMI 31.71 kg/m  General:   Alert and oriented. No distress noted. Pleasant and cooperative.  Head:  Normocephalic and atraumatic. Eyes:  Conjuctiva clear without scleral icterus. Heart:  S1, S2 present without murmurs appreciated. Lungs:  Clear to auscultation bilaterally. No wheezes, rales, or rhonchi. No distress.  Abdomen: +BS, soft, and non-distended. Mild generalized TTP. No rebound or guarding. No HSM or masses noted. Msk:  Symmetrical without gross deformities. Normal posture. Extremities:  Without edema. Neurologic:  Alert and  oriented x4 Psych:  Normal mood and affect.   Assessment:  30 year old female with history of abdominal pain, constipation, positive hep C antibody but negative RNA felt to be due to spontaneous clearance, and  GERD with mild erosive reflux esophagitis noted on EGD last year, presenting today with chief complaint of persistent nausea/vomiting and abdominal pain.  Nausea/vomiting/abdominal pain/GERD:  Persistent/progressive nausea with intermittent vomiting for the last 4-6 months with associated upper abdominal pain. Interestingly, she has found that if she blends up her food, she does not have any worsening of her abdominal pain and does not have vomiting, but continues with nausea.  If eating solid foods, she will vomit. Denies dysphagia symptoms or weight loss. Also reports foul-smelling belches and breakthrough GERD symptoms 2-3 times per week on omeprazole 20 mg twice daily.  No known history of diabetes, but she reports she has been told that her blood sugars are fluctuating and have been elevated. Denies hematemesis, BRBPR, melena.  Bowels are moving fairly well. On exam, she has mild generalized TTP which she reports is actually much better than when she presented to [facility] emergency room in August.  CT on file from August at the time of ER evaluation with questionable segmental wall thickening of the colon (?descending vs ascending-report is inconsistent), normal-appearing gallbladder and pancreas.  Notably, she was having diarrhea in August, but this has resolved.  Ultrasound last year with no evidence of gallstones.  Laboratory evaluation in August and October for the same symptoms have shown normal LFTs.  Lipase elevated at 138 in August, but normalized in October. Last EGD May 2021 with nonobstructing Schatzki's ring not manipulated, mild erosive reflux esophagitis, small hiatal hernia.  Denies NSAIDs.  Differentials include uncontrolled GERD, reflux esophagitis, gastritis, duodenitis, PUD, gastroparesis.  Biliary etiology remains in the differential, but I feel this is less likely with normal imaging recently and normal LFTs.  Doubt colonic etiology of postprandial nausea/vomiting though abnormal CT  findings do need to be followed up on.  At this time, we will plan for an EGD for further evaluation, update BMP to ensure electrolytes remained stable with recurrent vomiting, and obtain pregnancy test as LMP was 07/17/2021.  We will need to circle back to a colonoscopy as she will not tolerate the prep at this time.   Constipation:  Chronic.  Improved with Amitiza twice daily.  Typically with 2 BMs per day without alarm symptoms, but still straining at times and occasional lower abdominal pain though she feels this is influenced by her menstrual cycles.  Recommended adding MiraLAX as needed.   Plan:  EGD with propofol with Dr. Gala Romney ASAP. The risks, benefits, and alternatives have been discussed with the patient in detail. The patient states understanding and desires to proceed. ASA 2 BMP and beta hCG (patient requested blood rather than urine pregnancy test). Increase omeprazole to 40 mg twice daily. Continue Zofran as needed for nausea/vomiting. Continue to follow a soft/blended food diet for now. Avoid fried, fatty, greasy, spicy foods. 4-6 small meals daily. No eating within 3 hours of laying down. Continue Amitiza 24 mcg twice daily. Add MiraLAX 1 capful (17 g) in 8 ounces of water as needed for breakthrough constipation/hard stools. Circle back to colonoscopy once nausea/vomiting is under better control. Follow-up after procedure.   Aliene Altes, PA-C [facility] 09/11/2021

## 2021-09-09 NOTE — H&P (View-Only) (Signed)
  Referring Provider: Elkins, Wilson Oliver, * Primary Care Physician:  Elkins, Wilson Oliver, MD Primary GI Physician: Dr. Rourk  Chief Complaint  Patient presents with   Emesis    foamy   abdominal tenderness    HPI:   Samantha Russell is a 30 y.o. 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 Colonoscopy on file May 2021 normal. EGD as well May 2021 with non-obstructing Schatzki ring, not manipulated. Mild erosive reflux esophagitis, small hiatal hernia. She is presenting today for follow-up of nausea/vomiting and abdominal pain.   Last seen in our office 06/20/2021.  She had been inpatient at [facility] with dehydration in the setting of epigastric pain, nausea/vomiting since July.  She was able to keep liquids down at the time of her office visit.  Noted worsening of symptoms with menstrual cycle and milk.  Also reported diarrhea when she went to the hospital and was told she had colitis.  No stool studies.  Her last 2 bowel movements were smaller particles and mucus.  She had been off of PPI for a while.  Not taking Amitiza routinely.  Planned to obtain outside labs and CT from [facility].  Hold off on stool studies as diarrhea had resolved.  Increase omeprazole to twice daily, Zofran 3 times daily before meals, Carafate short-term before meals and at bedtime.  CT received and reviewed.  Findings vs impression varied- Possible thickening of descending vs ascending colon.  Patient reported she was no longer having diarrhea.  Reported she has been seen again in [facility] ER due to ongoing nausea in October.  Requested records.  Labs reviewed, CBC, LFTs, and lipase normal, no imaging completed.  Stated she would benefit from an EGD.  She was scheduled for an office visit on 11/15 with Dr. Rourk.  Patient canceled her appointment 11/15.  Today: Nausea/vomiting daily for the last month.  Symptoms initially flared up about 4-6 months. Blood sugars fluctuating. Was told her blood sugars have been high, no diagnosis of diabetes. Trouble with her K due to the vomiting. Scared to eat solid foods.If she blends her foods, she doesn't have any worsening abdominal pain or vomiting, but continues with nausea.  Usually, she vomits clear liquid or foam, no hematemesis.  May vomit 2 to 3 days a week.  Stress will also cause her to vomit.  She always has upper abdominal pain, but can have pain all over at times.  Feels her upper abdomen gets swollen at times.  She has trouble describing her pain to me.  States it can be every type of pain.  Some improvement with hot baths.  Breakthrough GERD symptoms 2-3 times a week on omeprazole 20 mg twice daily.  No dysphagia.  Uses Zofran daily to help with nausea.  Stools are moving twice a day with Amitiza BID. No brbpr or melena. Straining at times. Some lower abdominal pain at times. Menstrual cycle influences her lower abdominal pain and other GI symptoms in general.   No weight loss.   NSAIDs: None.  Rare alcohol.   Past Medical History:  Diagnosis Date   Asthma    Bipolar 1 disorder (HCC)    Constipation    GERD (gastroesophageal reflux disease)    Multiple gastric ulcers    in [place], [place]   Stomach ulcer     Past Surgical History:  Procedure Laterality Date   COLONOSCOPY WITH PROPOFOL   N/A 02/13/2020   normal colon, normal TI, internal and external hemorrhoids   DILATION AND CURETTAGE OF UTERUS     ESOPHAGOGASTRODUODENOSCOPY  2013   [place], [place]   ESOPHAGOGASTRODUODENOSCOPY (EGD) WITH PROPOFOL N/A 02/13/2020   non-obstructing Schatzki ring, not manipulated. Mild erosive reflux esophagitis, small hiatal hernia    Current Outpatient Medications  Medication Sig Dispense Refill   albuterol (VENTOLIN HFA) 108 (90 Base) MCG/ACT inhaler Inhale 2 puffs into the lungs every 6 (six) hours as needed for wheezing or shortness of breath. 1 Inhaler  1   AMITIZA 24 MCG capsule TAKE 1 CAPSULE (24 MCG TOTAL) BY MOUTH 2 (TWO) TIMES DAILY WITH A MEAL. (Patient taking differently: 24 mcg 2 (two) times daily with a meal.) 60 capsule 11   diphenhydrAMINE (BENADRYL) 25 MG tablet Take 25 mg by mouth every 6 (six) hours as needed for allergies.      fluticasone (FLONASE) 50 MCG/ACT nasal spray Take 1-2 sprays daily (Patient taking differently: Place 2 sprays into both nostrils daily as needed for allergies.) 16 g 5   fluticasone (FLOVENT HFA) 110 MCG/ACT inhaler Inhale 2 puffs into the lungs 2 (two) times a day. 1 Inhaler 5   lamoTRIgine (LAMICTAL) 100 MG tablet Take 100 mg by mouth 2 (two) times daily.     omeprazole (PRILOSEC) 40 MG capsule Take 1 capsule (40 mg total) by mouth 2 (two) times daily before a meal. 60 capsule 3   ondansetron (ZOFRAN ODT) 4 MG disintegrating tablet Take 1 tablet (4 mg total) by mouth 3 (three) times daily before meals. (Patient taking differently: Take 8 mg by mouth 3 (three) times daily before meals.) 90 tablet 3   QUEtiapine (SEROQUEL) 100 MG tablet Take 100-200 mg by mouth See admin instructions. 100-150 mg twice daily, and 200 mg at bedtime     sertraline (ZOLOFT) 100 MG tablet Take 100 mg by mouth in the morning and at bedtime.     sucralfate (CARAFATE) 1 GM/10ML suspension Take 10 mLs (1 g total) by mouth 4 (four) times daily. 420 mL 1   ondansetron (ZOFRAN-ODT) 8 MG disintegrating tablet Take 8 mg by mouth 2 (two) times daily.     No current facility-administered medications for this visit.    Allergies as of 09/11/2021 - Review Complete 09/11/2021  Allergen Reaction Noted   Penicillins Other (See Comments) 06/24/2015    Family History  Problem Relation Age of Onset   Anxiety disorder Mother    Depression Mother    Heart disease Father    Diabetes Maternal Grandmother    Diabetes Paternal Grandmother    Colon cancer Neg Hx     Social History   Socioeconomic History   Marital status: Married     Spouse name: Not on file   Number of children: Not on file   Years of education: Not on file   Highest education level: Not on file  Occupational History   Not on file  Tobacco Use   Smoking status: Every Day    Packs/day: 0.50    Years: 10.00    Pack years: 5.00    Types: Cigarettes   Smokeless tobacco: Never  Vaping Use   Vaping Use: Never used  Substance and Sexual Activity   Alcohol use: Yes    Comment: rare   Drug use: No   Sexual activity: Yes    Birth control/protection: None  Other Topics Concern   Not on file  Social History Narrative   Not on  file   Social Determinants of Health   Financial Resource Strain: Not on file  Food Insecurity: Not on file  Transportation Needs: Not on file  Physical Activity: Not on file  Stress: Not on file  Social Connections: Not on file    Review of Systems: Gen: Denies fever, chills, cold or flulike symptoms, presyncope, syncope. CV: Denies chest pain, palpitations Resp: Denies dyspnea or cough. GI: See HPI Heme: See HPI  Physical Exam: BP 106/72   Pulse (!) 119   Temp (!) 97.3 F (36.3 C) (Temporal)   Ht 5\' 3"  (1.6 m)   Wt 179 lb (81.2 kg)   LMP 07/17/2021 (Approximate)   BMI 31.71 kg/m  General:   Alert and oriented. No distress noted. Pleasant and cooperative.  Head:  Normocephalic and atraumatic. Eyes:  Conjuctiva clear without scleral icterus. Heart:  S1, S2 present without murmurs appreciated. Lungs:  Clear to auscultation bilaterally. No wheezes, rales, or rhonchi. No distress.  Abdomen: +BS, soft, and non-distended. Mild generalized TTP. No rebound or guarding. No HSM or masses noted. Msk:  Symmetrical without gross deformities. Normal posture. Extremities:  Without edema. Neurologic:  Alert and  oriented x4 Psych:  Normal mood and affect.   Assessment:  30 year old female with history of abdominal pain, constipation, positive hep C antibody but negative RNA felt to be due to spontaneous clearance, and  GERD with mild erosive reflux esophagitis noted on EGD last year, presenting today with chief complaint of persistent nausea/vomiting and abdominal pain.  Nausea/vomiting/abdominal pain/GERD:  Persistent/progressive nausea with intermittent vomiting for the last 4-6 months with associated upper abdominal pain. Interestingly, she has found that if she blends up her food, she does not have any worsening of her abdominal pain and does not have vomiting, but continues with nausea.  If eating solid foods, she will vomit. Denies dysphagia symptoms or weight loss. Also reports foul-smelling belches and breakthrough GERD symptoms 2-3 times per week on omeprazole 20 mg twice daily.  No known history of diabetes, but she reports she has been told that her blood sugars are fluctuating and have been elevated. Denies hematemesis, BRBPR, melena.  Bowels are moving fairly well. On exam, she has mild generalized TTP which she reports is actually much better than when she presented to [facility] emergency room in August.  CT on file from August at the time of ER evaluation with questionable segmental wall thickening of the colon (?descending vs ascending-report is inconsistent), normal-appearing gallbladder and pancreas.  Notably, she was having diarrhea in August, but this has resolved.  Ultrasound last year with no evidence of gallstones.  Laboratory evaluation in August and October for the same symptoms have shown normal LFTs.  Lipase elevated at 138 in August, but normalized in October. Last EGD May 2021 with nonobstructing Schatzki's ring not manipulated, mild erosive reflux esophagitis, small hiatal hernia.  Denies NSAIDs.  Differentials include uncontrolled GERD, reflux esophagitis, gastritis, duodenitis, PUD, gastroparesis.  Biliary etiology remains in the differential, but I feel this is less likely with normal imaging recently and normal LFTs.  Doubt colonic etiology of postprandial nausea/vomiting though abnormal CT  findings do need to be followed up on.  At this time, we will plan for an EGD for further evaluation, update BMP to ensure electrolytes remained stable with recurrent vomiting, and obtain pregnancy test as LMP was 07/17/2021.  We will need to circle back to a colonoscopy as she will not tolerate the prep at this time.   Constipation:  Chronic.  Improved with Amitiza twice daily.  Typically with 2 BMs per day without alarm symptoms, but still straining at times and occasional lower abdominal pain though she feels this is influenced by her menstrual cycles.  Recommended adding MiraLAX as needed.   Plan:  EGD with propofol with Dr. Gala Romney ASAP. The risks, benefits, and alternatives have been discussed with the patient in detail. The patient states understanding and desires to proceed. ASA 2 BMP and beta hCG (patient requested blood rather than urine pregnancy test). Increase omeprazole to 40 mg twice daily. Continue Zofran as needed for nausea/vomiting. Continue to follow a soft/blended food diet for now. Avoid fried, fatty, greasy, spicy foods. 4-6 small meals daily. No eating within 3 hours of laying down. Continue Amitiza 24 mcg twice daily. Add MiraLAX 1 capful (17 g) in 8 ounces of water as needed for breakthrough constipation/hard stools. Circle back to colonoscopy once nausea/vomiting is under better control. Follow-up after procedure.   Aliene Altes, PA-C [facility] 09/11/2021

## 2021-09-11 ENCOUNTER — Encounter: Payer: Self-pay | Admitting: Gastroenterology

## 2021-09-11 ENCOUNTER — Encounter: Payer: Self-pay | Admitting: *Deleted

## 2021-09-11 ENCOUNTER — Other Ambulatory Visit: Payer: Self-pay | Admitting: *Deleted

## 2021-09-11 ENCOUNTER — Other Ambulatory Visit: Payer: Self-pay

## 2021-09-11 ENCOUNTER — Ambulatory Visit: Payer: Medicaid Other | Admitting: Gastroenterology

## 2021-09-11 VITALS — BP 106/72 | HR 119 | Temp 97.3°F | Ht 63.0 in | Wt 179.0 lb

## 2021-09-11 DIAGNOSIS — R1084 Generalized abdominal pain: Secondary | ICD-10-CM

## 2021-09-11 DIAGNOSIS — K219 Gastro-esophageal reflux disease without esophagitis: Secondary | ICD-10-CM

## 2021-09-11 DIAGNOSIS — K5900 Constipation, unspecified: Secondary | ICD-10-CM | POA: Diagnosis not present

## 2021-09-11 DIAGNOSIS — R112 Nausea with vomiting, unspecified: Secondary | ICD-10-CM

## 2021-09-11 MED ORDER — OMEPRAZOLE 40 MG PO CPDR: 40.0000 mg | DELAYED_RELEASE_CAPSULE | Freq: Two times a day (BID) | ORAL | 3 refills | Status: AC

## 2021-09-11 NOTE — Patient Instructions (Addendum)
Please have blood work completed at [facility].  We will arrange for you to have an upper endoscopy with Dr. Jena Gauss in the near future.  Increase omeprazole to 40 mg twice daily 30 minutes before breakfast and dinner.  I have sent a new prescription to your pharmacy.  Continue Zofran as needed for nausea/vomiting.  Continue follow a soft diet with diet for now.  Avoid fried, fatty, greasy, spicy foods.  Recommend eating 4-6 small meals daily.  Do not eat within 3 hours of laying down.  Continue Amitiza 24 mcg twice daily for constipation.  You may add MiraLAX 1 capful (17 g) daily in 8 ounces of water if you feel Amitiza is not working well enough for you.  We will see you back in the office after your procedure.   It was good to meet you today!  I am sorry you are not feeling well!   Ermalinda Memos, PA-C [facility]

## 2021-09-12 LAB — BASIC METABOLIC PANEL
BUN: 9 mg/dL (ref 7–25)
CO2: 22 mmol/L (ref 20–32)
Calcium: 9.3 mg/dL (ref 8.6–10.2)
Chloride: 109 mmol/L (ref 98–110)
Creat: 0.71 mg/dL (ref 0.50–0.97)
Glucose, Bld: 94 mg/dL (ref 65–99)
Potassium: 4.4 mmol/L (ref 3.5–5.3)
Sodium: 141 mmol/L (ref 135–146)

## 2021-09-12 LAB — HCG, QUANTITATIVE, PREGNANCY: HCG, Total, QN: <3 m[IU]/mL

## 2021-09-13 ENCOUNTER — Encounter: Payer: Self-pay | Admitting: Gastroenterology

## 2021-09-16 ENCOUNTER — Other Ambulatory Visit: Payer: Self-pay

## 2021-09-16 ENCOUNTER — Encounter: Payer: Self-pay | Admitting: *Deleted

## 2021-09-16 ENCOUNTER — Ambulatory Visit (HOSPITAL_COMMUNITY): Payer: Medicaid Other | Admitting: Anesthesiology

## 2021-09-16 ENCOUNTER — Ambulatory Visit (HOSPITAL_COMMUNITY)
Admission: RE | Admit: 2021-09-16 | Discharge: 2021-09-16 | Disposition: A | Discharge status: Home / Self Care | Payer: Medicaid Other | Attending: Internal Medicine | Admitting: Internal Medicine

## 2021-09-16 ENCOUNTER — Encounter (HOSPITAL_COMMUNITY): Payer: Self-pay | Admitting: Internal Medicine

## 2021-09-16 ENCOUNTER — Encounter: Payer: Self-pay | Admitting: Internal Medicine

## 2021-09-16 ENCOUNTER — Encounter (HOSPITAL_COMMUNITY)
Admission: RE | Disposition: A | Discharge status: Home / Self Care | Payer: Self-pay | Source: Home / Self Care | Attending: Internal Medicine

## 2021-09-16 DIAGNOSIS — F319 Bipolar disorder, unspecified: Secondary | ICD-10-CM | POA: Diagnosis not present

## 2021-09-16 DIAGNOSIS — R112 Nausea with vomiting, unspecified: Secondary | ICD-10-CM | POA: Insufficient documentation

## 2021-09-16 DIAGNOSIS — K2100 Gastro-esophageal reflux disease with esophagitis, without bleeding: Secondary | ICD-10-CM | POA: Diagnosis not present

## 2021-09-16 DIAGNOSIS — K5909 Other constipation: Secondary | ICD-10-CM | POA: Diagnosis not present

## 2021-09-16 DIAGNOSIS — J45909 Unspecified asthma, uncomplicated: Secondary | ICD-10-CM | POA: Diagnosis not present

## 2021-09-16 DIAGNOSIS — R1310 Dysphagia, unspecified: Secondary | ICD-10-CM | POA: Diagnosis present

## 2021-09-16 DIAGNOSIS — K449 Diaphragmatic hernia without obstruction or gangrene: Secondary | ICD-10-CM | POA: Insufficient documentation

## 2021-09-16 DIAGNOSIS — M3119 Other thrombotic microangiopathy: Secondary | ICD-10-CM | POA: Diagnosis not present

## 2021-09-16 HISTORY — PX: MALONEY DILATION: SHX5535

## 2021-09-16 HISTORY — PX: ESOPHAGOGASTRODUODENOSCOPY (EGD) WITH PROPOFOL: SHX5813

## 2021-09-16 HISTORY — PX: BIOPSY: SHX5522

## 2021-09-16 SURGERY — ESOPHAGOGASTRODUODENOSCOPY (EGD) WITH PROPOFOL
Anesthesia: General

## 2021-09-16 MED ORDER — LIDOCAINE HCL 1 % IJ SOLN
INTRAMUSCULAR | Status: DC | PRN
  Administered 2021-09-16: 50 mg via INTRADERMAL

## 2021-09-16 MED ORDER — LACTATED RINGERS IV SOLN: INTRAVENOUS | Status: DC

## 2021-09-16 MED ORDER — PROPOFOL 10 MG/ML IV BOLUS
INTRAVENOUS | Status: DC | PRN
  Administered 2021-09-16: 30 mg via INTRAVENOUS
  Administered 2021-09-16: 150 mg via INTRAVENOUS
  Administered 2021-09-16 (×2): 50 mg via INTRAVENOUS

## 2021-09-16 MED ORDER — PROPOFOL 500 MG/50ML IV EMUL: INTRAVENOUS | Status: DC | PRN

## 2021-09-16 NOTE — Transfer of Care (Signed)
Immediate Anesthesia Transfer of Care Note  Patient: Samantha Russell  Procedure(s) Performed: ESOPHAGOGASTRODUODENOSCOPY (EGD) WITH PROPOFOL MALONEY DILATION BIOPSY  Patient Location: Endoscopy Unit  Anesthesia Type:General  Level of Consciousness: awake  Airway & Oxygen Therapy: Patient Spontanous Breathing  Post-op Assessment: Report given to RN  Post vital signs: Reviewed  Last Vitals:  Vitals Value Taken Time  BP    Temp 36.8 C 09/16/21 1049  Pulse 83 09/16/21 1049  Resp 17 09/16/21 1049  SpO2 97 % 09/16/21 1049    Last Pain:  Vitals:   09/16/21 1026  TempSrc:   PainSc: 3       Patients Stated Pain Goal: 0 (09/16/21 1026)  Complications: No notable events documented.

## 2021-09-16 NOTE — Progress Notes (Signed)
Please excuse Samantha Russell from work through 11:00am on Tuesday December, 6th 2022. She cannot drive, operate machinery or sign legal documents for 24 hours. She can return to work on Tuesday September 17, 2021 after 11:00am.

## 2021-09-16 NOTE — Op Note (Signed)
[facility] Patient Name: Samantha Russell Procedure Date: 09/16/2021 10:11 AM MRN: [id number] Date of Birth: May 24, 1991 Attending MD: Gennette Pac , MD CSN: [id number] Age: 30 Admit Type: Outpatient Procedure:                Upper GI endoscopy Indications:              Dysphagia, Nausea with vomiting Providers:                Gennette Pac, MD, Edrick Kins, RN, Pandora Leiter, Technician Referring MD:              Medicines:                Propofol per Anesthesia Complications:            No immediate complications. Estimated Blood Loss:     Estimated blood loss was minimal. Procedure:                Pre-Anesthesia Assessment:                           - Prior to the procedure, a History and Physical                            was performed, and patient medications and                            allergies were reviewed. The patient's tolerance of                            previous anesthesia was also reviewed. The risks                            and benefits of the procedure and the sedation                            options and risks were discussed with the patient.                            All questions were answered, and informed consent                            was obtained. Prior Anticoagulants: The patient has                            taken no previous anticoagulant or antiplatelet                            agents. ASA Grade Assessment: II - A patient with                            mild systemic disease. After reviewing the risks  and benefits, the patient was deemed in                            satisfactory condition to undergo the procedure.                           After obtaining informed consent, the endoscope was                            passed under direct vision. Throughout the                            procedure, the patient's blood pressure, pulse, and                             oxygen saturations were monitored continuously. The                            GIF-H190 ([id number]) scope was introduced through the                            mouth, and advanced to the third part of duodenum.                            The upper GI endoscopy was accomplished without                            difficulty. The patient tolerated the procedure                            well. Scope In: 10:31:17 AM Scope Out: 10:42:39 AM Total Procedure Duration: 0 hours 11 minutes 22 seconds  Findings:      The examined esophagus was normal.      A small hiatal hernia was present. Scattered submucosal petechiae and       mottling of the gastric body mucosa. No ulcer or infiltrating process       observed. This was biopsied with a cold forceps. Estimated blood loss       was minimal.      The duodenal bulb, second portion of the duodenum and third portion of       the duodenum were normal. The scope was withdrawn. Dilation was       performed with a Maloney dilator with mild resistance at 54 Fr. The       dilation site was examined following endoscope reinsertion and showed no       change. Estimated blood loss: none. Finally, biopsies of the antrum body       and fundus as well as the second and third portion of the were taken for       histologic study. Impression:               - Normal esophagus. Dilated. Small hiatal hernia.                           - Small hiatal hernia. Biopsied.                           -  Normal duodenal bulb, second portion of the                            duodenum and third portion of the duodenum. Moderate Sedation:      Moderate (conscious) sedation was personally administered by an       anesthesia professional. The following parameters were monitored: oxygen       saturation, heart rate, blood pressure, respiratory rate, EKG, adequacy       of pulmonary ventilation, and response to care. Recommendation:           - Patient has a contact number available for                             emergencies. The signs and symptoms of potential                            delayed complications were discussed with the                            patient. Return to normal activities tomorrow.                            Written discharge instructions were provided to the                            patient.                           - Advance diet as tolerated.                           - Continue present medications.                           - Return to my office in 2 months. Patient should                            stop using THC Procedure Code(s):        --- Professional ---                           [phone], Esophagogastroduodenoscopy, flexible,                            transoral; with biopsy, single or multiple                           43450, Dilation of esophagus, by unguided sound or                            bougie, single or multiple passes Diagnosis Code(s):        --- Professional ---                           K44.9, Diaphragmatic hernia without obstruction or  gangrene                           R13.10, Dysphagia, unspecified                           R11.2, Nausea with vomiting, unspecified CPT copyright 2019 American Medical Association. All rights reserved. The codes documented in this report are preliminary and upon coder review may  be revised to meet current compliance requirements. [name], MD Gennette Pac, MD 09/16/2021 10:52:31 AM This report has been signed electronically. Number of Addenda: 0

## 2021-09-16 NOTE — Anesthesia Postprocedure Evaluation (Signed)
Anesthesia Post Note  Patient: Samantha Russell  Procedure(s) Performed: ESOPHAGOGASTRODUODENOSCOPY (EGD) WITH PROPOFOL MALONEY DILATION BIOPSY  Patient location during evaluation: Short Stay Anesthesia Type: General Level of consciousness: awake and alert Pain management: pain level controlled Vital Signs Assessment: post-procedure vital signs reviewed and stable Respiratory status: spontaneous breathing Cardiovascular status: blood pressure returned to baseline and stable Postop Assessment: no apparent nausea or vomiting Anesthetic complications: no   No notable events documented.   Last Vitals:  Vitals:   09/16/21 0820 09/16/21 1049  BP: 106/69   Pulse: 88 83  Resp: 18 17  Temp: 36.8 C 36.8 C  SpO2: 100% 97%    Last Pain:  Vitals:   09/16/21 1026  TempSrc:   PainSc: 3                  Esmirna Ravan

## 2021-09-16 NOTE — Discharge Instructions (Addendum)
EGD Discharge instructions Please read the instructions outlined below and refer to this sheet in the next few weeks. These discharge instructions provide you with general information on caring for yourself after you leave the hospital. Your doctor may also give you specific instructions. While your treatment has been planned according to the most current medical practices available, unavoidable complications occasionally occur. If you have any problems or questions after discharge, please call your doctor. ACTIVITY You may resume your regular activity but move at a slower pace for the next 24 hours.  Take frequent rest periods for the next 24 hours.  Walking will help expel (get rid of) the air and reduce the bloated feeling in your abdomen.  No driving for 24 hours (because of the anesthesia (medicine) used during the test).  You may shower.  Do not sign any important legal documents or operate any machinery for 24 hours (because of the anesthesia used during the test).  NUTRITION Drink plenty of fluids.  You may resume your normal diet.  Begin with a light meal and progress to your normal diet.  Avoid alcoholic beverages for 24 hours or as instructed by your caregiver.  MEDICATIONS You may resume your normal medications unless your caregiver tells you otherwise.  WHAT YOU CAN EXPECT TODAY You may experience abdominal discomfort such as a feeling of fullness or "gas" pains.  FOLLOW-UP Your doctor will discuss the results of your test with you.  SEEK IMMEDIATE MEDICAL ATTENTION IF ANY OF THE FOLLOWING OCCUR: Excessive nausea (feeling sick to your stomach) and/or vomiting.  Severe abdominal pain and distention (swelling).  Trouble swallowing.  Temperature over 101 F (37.8 C).  Rectal bleeding or vomiting of blood.     Your esophagus was stretched today.  Your stomach was mildly inflamed.  Biopsies taken.  I Recommend you stop using THC products.  Office visit with Lewie Loron in 6  weeks  At patient request, I called Alethia Berthold at [phone] -reviewed findings and recommendations     Office voice mail [facility]) , left message to call patient regarding follow up appointment

## 2021-09-16 NOTE — Anesthesia Preprocedure Evaluation (Addendum)
Anesthesia Evaluation  Patient identified by MRN, date of birth, ID band Patient awake    Reviewed: Allergy & Precautions, NPO status , Patient's Chart, lab work & pertinent test results  Airway Mallampati: II  TM Distance: >3 FB Neck ROM: Full    Dental  (+) Dental Advisory Given, Teeth Intact   Pulmonary asthma , Current Smoker and Patient abstained from smoking.,    Pulmonary exam normal breath sounds clear to auscultation       Cardiovascular Exercise Tolerance: Good Normal cardiovascular exam Rhythm:Regular Rate:Normal     Neuro/Psych PSYCHIATRIC DISORDERS Bipolar Disorder    GI/Hepatic PUD, GERD  Medicated and Poorly Controlled,(+) Hepatitis -, C  Endo/Other  negative endocrine ROS  Renal/GU negative Renal ROS     Musculoskeletal negative musculoskeletal ROS (+)   Abdominal   Peds  Hematology negative hematology ROS (+)   Anesthesia Other Findings   Reproductive/Obstetrics negative OB ROS                            Anesthesia Physical Anesthesia Plan  ASA: 2  Anesthesia Plan: General   Post-op Pain Management: Minimal or no pain anticipated   Induction: Intravenous  PONV Risk Score and Plan: TIVA  Airway Management Planned: Nasal Cannula and Natural Airway  Additional Equipment:   Intra-op Plan:   Post-operative Plan:   Informed Consent: I have reviewed the patients History and Physical, chart, labs and discussed the procedure including the risks, benefits and alternatives for the proposed anesthesia with the patient or authorized representative who has indicated his/her understanding and acceptance.     Dental advisory given  Plan Discussed with: CRNA and Surgeon  Anesthesia Plan Comments:        Anesthesia Quick Evaluation

## 2021-09-16 NOTE — Interval H&P Note (Signed)
History and Physical Interval Note:  09/16/2021 10:13 AM  Samantha Russell  has presented today for surgery, with the diagnosis of nausea, vomiting, abd pain, gerd.  The various methods of treatment have been discussed with the patient and family. After consideration of risks, benefits and other options for treatment, the patient has consented to  Procedure(s) with comments: ESOPHAGOGASTRODUODENOSCOPY (EGD) WITH PROPOFOL (N/A) - 9:45am as a surgical intervention.  The patient's history has been reviewed, patient examined, no change in status, stable for surgery.  I have reviewed the patient's chart and labs.  Questions were answered to the patient's satisfaction.     [name]  Patient goes through a 2 g CBD pen which she smokes monthly.  Just bought a new 1.  Has to blend all of her food.  Vague dysphagia symptoms (known Schatzki's ring).  I have offered the patient EGD with esophageal dilation as feasible/appropriate per plan today.  Potential for biopsy is also reviewed.  The risks, benefits, limitations, alternatives and imponderables have been reviewed with the patient. Potential for esophageal dilation, biopsy, etc. have also been reviewed.  Questions have been answered. All parties agreeable.

## 2021-09-17 ENCOUNTER — Encounter: Payer: Self-pay | Admitting: Internal Medicine

## 2021-09-17 LAB — SURGICAL PATHOLOGY

## 2021-09-18 ENCOUNTER — Telehealth: Payer: Self-pay | Admitting: Internal Medicine

## 2021-09-18 NOTE — Telephone Encounter (Signed)
Pt was made aware and verbalized understanding. Routing to the front to add pt to cancellation list for a sooner appointment.

## 2021-09-18 NOTE — Telephone Encounter (Signed)
Pt called stating that her stomach is very swollen and she is very nauseous. Pt states that she has only been able to eat a little bit of mashed potatoes since having her EGD.

## 2021-09-18 NOTE — Telephone Encounter (Signed)
PATIENT CALLED WANTING TO SPEAK TO THE DOCTOR TO TELL HER SYMPTOMS. HAVING ISSUES FROM HER PROCEDURE

## 2021-09-19 ENCOUNTER — Encounter (HOSPITAL_COMMUNITY): Payer: Self-pay | Admitting: Internal Medicine

## 2021-10-30 ENCOUNTER — Ambulatory Visit: Payer: Medicaid Other | Admitting: Gastroenterology

## 2021-10-30 ENCOUNTER — Encounter: Payer: Self-pay | Admitting: Gastroenterology

## 2021-10-30 ENCOUNTER — Telehealth: Payer: Self-pay | Admitting: Internal Medicine

## 2021-10-30 ENCOUNTER — Telehealth: Payer: Self-pay | Admitting: *Deleted

## 2021-10-30 ENCOUNTER — Other Ambulatory Visit: Payer: Self-pay

## 2021-10-30 VITALS — BP 108/72 | HR 85 | Temp 97.1°F | Ht 63.0 in | Wt 184.8 lb

## 2021-10-30 DIAGNOSIS — R109 Unspecified abdominal pain: Secondary | ICD-10-CM

## 2021-10-30 DIAGNOSIS — R102 Pelvic and perineal pain: Secondary | ICD-10-CM

## 2021-10-30 DIAGNOSIS — R232 Flushing: Secondary | ICD-10-CM | POA: Diagnosis not present

## 2021-10-30 DIAGNOSIS — R112 Nausea with vomiting, unspecified: Secondary | ICD-10-CM | POA: Diagnosis not present

## 2021-10-30 DIAGNOSIS — K219 Gastro-esophageal reflux disease without esophagitis: Secondary | ICD-10-CM | POA: Diagnosis not present

## 2021-10-30 DIAGNOSIS — R3989 Other symptoms and signs involving the genitourinary system: Secondary | ICD-10-CM

## 2021-10-30 MED ORDER — HYOSCYAMINE SULFATE 0.125 MG PO TABS: 0.1250 mg | ORAL_TABLET | ORAL | 0 refills | Status: DC | PRN

## 2021-10-30 NOTE — Telephone Encounter (Signed)
GES scheduled for 1/26 at 8:00am, arrival 7:30am, npo midnight, no stomach medications after midnight (including nausea meds) HIDA scheduled for 1/20 at 8:30am, arrival 8:00am, npo midnight, no pain meds after midnight  Checked AIM website and no PA is needed.   Called pt, LM to call back for appt information.

## 2021-10-30 NOTE — Patient Instructions (Signed)
I have ordered a gastric emptying study and a test to measure your gallbladder function.  I've also ordered a 24 hour urine collection to check for what's called a carcinoid syndrome. This is unlikely but needs to be ruled out.  We are referring you to Urology.  I have sent in Levsin to take for severe attacks. This can cause constipation, so limit use to only when needed.  Further recommendations to follow!  I enjoyed seeing you again today! As you know, I value our relationship and want to provide genuine, compassionate, and quality care. I welcome your feedback. If you receive a survey regarding your visit,  I greatly appreciate you taking time to fill this out. See you next time!  Gelene Mink, PhD, ANP-BC [facility]

## 2021-10-30 NOTE — Progress Notes (Signed)
Referring Provider: Kaleen Mask, * Primary Care Physician:  Kaleen Mask, MD Primary GI: Dr. Jena Gauss    Chief Complaint  Patient presents with   Nausea    With vomiting.   Dysphagia    Reports nervous about swallowing, dissolvable zofran gets stuck   Abdominal Pain    Not getting any better. Excruciating pain x yesterday   change in bowels    Stools for 2 days appears mucus like, white mixed in stool.     HPI:   Samantha Russell is a 31 y.o. female presenting today with a history of abdominal pain, constipation, positive Hep C antibody but negative RNA felt to be due to spontaneous clearance, and GERD. Colonoscopy on file May 2021 normal. She has been dealing with epigastric pain, N/V since July. EGD Dec 2022: normal esophagus s/p dilation, small hiatal hernia, normal duodenum.    Bad belching after procedure. Foul smelling. Stomach pain continues. Eats a beef stick or a few crackers a day. Stomach pain is always present. Nothing relieves pain. Will get in a bath with bad episodes. Has had to miss work. Nausea constant. Vomiting intermittently.    On the constipated side. White in stool with mucus along with brown. Sometimes feels like stripping the lining of intestines with having BM. BM depends on what she eats.   Marijuana intermittently. If separating or lengthening abdomen, will feel better. Pain radiates epigastric and down. Doesn't think marijuana related.   Only thing that helps is a hot bath. Pain can be severe, feels hot and cold.   Omeprazole BID prescribed but taking OTC agents as she hadn't picked up.   Hot flash episodes. Facial flushing.   With bladder pressure, worsens pain. Feels abdominal pain when bladder full.   Past Medical History:  Diagnosis Date   Asthma    Bipolar 1 disorder (HCC)    Constipation    GERD (gastroesophageal reflux disease)    Multiple gastric ulcers    in [place], [place]   Stomach ulcer     Past Surgical  History:  Procedure Laterality Date   BIOPSY  09/16/2021   Procedure: BIOPSY;  Surgeon: Corbin Ade, MD;  [facility];  Service: Endoscopy;;   COLONOSCOPY WITH PROPOFOL N/A 02/13/2020   normal colon, normal TI, internal and external hemorrhoids   DILATION AND CURETTAGE OF UTERUS     ESOPHAGOGASTRODUODENOSCOPY  2013   [place], [place]   ESOPHAGOGASTRODUODENOSCOPY (EGD) WITH PROPOFOL N/A 02/13/2020   non-obstructing Schatzki ring, not manipulated. Mild erosive reflux esophagitis, small hiatal hernia   ESOPHAGOGASTRODUODENOSCOPY (EGD) WITH PROPOFOL N/A 09/16/2021   normal esophagus s/p dilation, small hiatal hernia, normal duodenum.   MALONEY DILATION  09/16/2021   Procedure: MALONEY DILATION;  Surgeon: Corbin Ade, MD;  [facility];  Service: Endoscopy;;    Current Outpatient Medications  Medication Sig Dispense Refill   albuterol (VENTOLIN HFA) 108 (90 Base) MCG/ACT inhaler Inhale 2 puffs into the lungs every 6 (six) hours as needed for wheezing or shortness of breath. 1 Inhaler 1   diphenhydrAMINE (BENADRYL) 25 MG tablet Take 25 mg by mouth every 6 (six) hours as needed for allergies.      fluticasone (FLONASE) 50 MCG/ACT nasal spray Take 1-2 sprays daily (Patient taking differently: Place 2 sprays into both nostrils daily as needed for allergies.) 16 g 5   fluticasone (FLOVENT HFA) 110 MCG/ACT inhaler Inhale 2 puffs into the lungs 2 (two) times a  day. 1 Inhaler 5   lamoTRIgine (LAMICTAL) 100 MG tablet Take 100 mg by mouth 2 (two) times daily.     omeprazole (PRILOSEC) 40 MG capsule Take 1 capsule (40 mg total) by mouth 2 (two) times daily before a meal. 60 capsule 3   ondansetron (ZOFRAN ODT) 4 MG disintegrating tablet Take 1 tablet (4 mg total) by mouth 3 (three) times daily before meals. (Patient taking differently: Take 8 mg by mouth every 4 (four) hours.) 90 tablet 3   QUEtiapine (SEROQUEL) 100 MG tablet Take 100-200 mg by mouth See admin instructions.  100-150 mg twice daily, and 200 mg at bedtime     sertraline (ZOLOFT) 100 MG tablet Take 100 mg by mouth in the morning and at bedtime.     sucralfate (CARAFATE) 1 GM/10ML suspension Take 10 mLs (1 g total) by mouth 4 (four) times daily. 420 mL 1   AMITIZA 24 MCG capsule TAKE 1 CAPSULE (24 MCG TOTAL) BY MOUTH 2 (TWO) TIMES DAILY WITH A MEAL. (Patient not taking: Reported on 10/30/2021) 60 capsule 11   ondansetron (ZOFRAN-ODT) 8 MG disintegrating tablet Take 8 mg by mouth 2 (two) times daily. (Patient not taking: Reported on 10/30/2021)     No current facility-administered medications for this visit.    Allergies as of 10/30/2021 - Review Complete 10/30/2021  Allergen Reaction Noted   Penicillins Other (See Comments) 06/24/2015    Family History  Problem Relation Age of Onset   Anxiety disorder Mother    Depression Mother    Heart disease Father    Diabetes Maternal Grandmother    Diabetes Paternal Grandmother    Colon cancer Neg Hx     Social History   Socioeconomic History   Marital status: Married    Spouse name: Not on file   Number of children: Not on file   Years of education: Not on file   Highest education level: Not on file  Occupational History   Not on file  Tobacco Use   Smoking status: Every Day    Packs/day: 0.50    Years: 10.00    Pack years: 5.00    Types: Cigarettes   Smokeless tobacco: Never  Vaping Use   Vaping Use: Never used  Substance and Sexual Activity   Alcohol use: Yes    Comment: rare   Drug use: No   Sexual activity: Yes    Birth control/protection: None  Other Topics Concern   Not on file  Social History Narrative   Not on file   Social Determinants of Health   Financial Resource Strain: Not on file  Food Insecurity: Not on file  Transportation Needs: Not on file  Physical Activity: Not on file  Stress: Not on file  Social Connections: Not on file    Review of Systems: Gen: Denies fever, chills, anorexia. Denies fatigue,  weakness, weight loss.  CV: Denies chest pain, palpitations, syncope, peripheral edema, and claudication. Resp: Denies dyspnea at rest, cough, wheezing, coughing up blood, and pleurisy. GI: see HPI Derm: Denies rash, itching, dry skin Psych: Denies depression, anxiety, memory loss, confusion. No homicidal or suicidal ideation.  Heme: Denies bruising, bleeding, and enlarged lymph nodes.  Physical Exam: BP 108/72    Pulse 85    Temp (!) 97.1 F (36.2 C)    Ht 5\' 3"  (1.6 m)    Wt 184 lb 12.8 oz (83.8 kg)    BMI 32.74 kg/m  General:   Alert and oriented. No distress noted.  Pleasant and cooperative.  Head:  Normocephalic and atraumatic. Eyes:  Conjuctiva clear without scleral icterus. Mouth: mask in place Abdomen:  +BS, soft, TTP epigastric and non-distended. No rebound or guarding. No HSM or masses noted. Msk:  Symmetrical without gross deformities. Normal posture. Extremities:  Without edema. Neurologic:  Alert and  oriented x4 Psych:  Alert and cooperative. Normal mood and affect.  ASSESSMENT/PLAN: Samantha Russell is a 31 y.o. female presenting today a 31 y.o. female presenting today with a history of abdominal pain, constipation, positive Hep C antibody but negative RNA felt to be due to spontaneous clearance, and GERD. Colonoscopy on file May 2021 normal. She has been dealing with epigastric pain, N/V since July. EGD Dec 2022: normal esophagus s/p dilation, small hiatal hernia, normal duodenum.   Abdominal pain: always present. Notes hot baths will help episodes. Associated nausea and vomiting. I suspect may have cannabinoid hyperemesis syndrome. She does not believe marijuana is contributing, despite our discussion today. She has not picked up omeprazole BID as prescribed. Interestingly, she does note hot flash episodes and facial flushing. Also noting that needing to urinate will worsen pain. She has worsened abdominal pain when bladder is full.   Ordering additional studies. CT already  on file from outside facility. I have ordered a HIDA scan, GES, and although doubt carcinoid etiology, I have requested 24 hour urine 5HIAA.  Start omeprazole BID. Refer to Urology ?IC, Levsin for severe attacks.  Further recommendations once tests reviewed.   Gelene Mink, PhD, ANP-BC [facility]

## 2021-10-30 NOTE — Telephone Encounter (Signed)
See prior note

## 2021-10-30 NOTE — Telephone Encounter (Signed)
Pt returning call. [phone]

## 2021-10-30 NOTE — Telephone Encounter (Signed)
Called pt and she is aware of appt details. She voiced understanding 

## 2021-10-31 ENCOUNTER — Other Ambulatory Visit: Payer: Self-pay | Admitting: Gastroenterology

## 2021-11-01 ENCOUNTER — Other Ambulatory Visit: Payer: Self-pay

## 2021-11-01 ENCOUNTER — Encounter (HOSPITAL_COMMUNITY)
Admission: RE | Admit: 2021-11-01 | Discharge: 2021-11-01 | Disposition: A | Discharge status: Home / Self Care | Payer: Medicaid Other | Source: Ambulatory Visit | Attending: Gastroenterology | Admitting: Gastroenterology

## 2021-11-01 DIAGNOSIS — R109 Unspecified abdominal pain: Secondary | ICD-10-CM | POA: Insufficient documentation

## 2021-11-01 DIAGNOSIS — R112 Nausea with vomiting, unspecified: Secondary | ICD-10-CM | POA: Insufficient documentation

## 2021-11-01 MED ORDER — TECHNETIUM TC 99M MEBROFENIN IV KIT
5.0000 | PACK | Freq: Once | INTRAVENOUS | Status: AC | PRN
  Administered 2021-11-01: 5 via INTRAVENOUS

## 2021-11-07 ENCOUNTER — Encounter (HOSPITAL_COMMUNITY): Payer: Self-pay

## 2021-11-07 ENCOUNTER — Other Ambulatory Visit: Payer: Self-pay

## 2021-11-07 ENCOUNTER — Telehealth: Payer: Self-pay

## 2021-11-07 ENCOUNTER — Encounter (HOSPITAL_COMMUNITY)
Admission: RE | Admit: 2021-11-07 | Discharge: 2021-11-07 | Disposition: A | Discharge status: Home / Self Care | Payer: Medicaid Other | Source: Ambulatory Visit | Attending: Gastroenterology | Admitting: Gastroenterology

## 2021-11-07 DIAGNOSIS — R109 Unspecified abdominal pain: Secondary | ICD-10-CM | POA: Diagnosis present

## 2021-11-07 DIAGNOSIS — R112 Nausea with vomiting, unspecified: Secondary | ICD-10-CM | POA: Insufficient documentation

## 2021-11-07 MED ORDER — TECHNETIUM TC 99M SULFUR COLLOID
2.0000 | Freq: Once | INTRAVENOUS | Status: AC | PRN
  Administered 2021-11-07: 2.2 via INTRAVENOUS

## 2021-11-07 NOTE — Telephone Encounter (Signed)
Returned the pt's call from vm left on the telephone and a very young child answered the phone jabbered something and hung up.

## 2021-11-12 DIAGNOSIS — R109 Unspecified abdominal pain: Secondary | ICD-10-CM

## 2021-11-13 NOTE — Telephone Encounter (Signed)
Samantha Russell, I have drafted a letter for patient. Please put in envelope for her to pick up. Thanks!

## 2021-11-13 NOTE — Telephone Encounter (Signed)
Noted  Done put up front for the pt.

## 2021-11-18 ENCOUNTER — Ambulatory Visit: Payer: Medicaid Other | Admitting: Urology

## 2021-11-25 NOTE — Addendum Note (Signed)
Addended by: Armstead Peaks on: 11/25/2021 11:58 AM   Modules accepted: Orders

## 2021-11-25 NOTE — Telephone Encounter (Signed)
[facility] clinical pool:  Please refer to Gen Surgery to consider cholecystectomy. We have had a thorough evaluation here. Unclear if cholecystectomy would be helpful or not in this case.

## 2021-11-26 ENCOUNTER — Ambulatory Visit: Payer: Medicaid Other | Admitting: Urology

## 2021-11-26 ENCOUNTER — Other Ambulatory Visit: Payer: Self-pay

## 2021-11-26 ENCOUNTER — Encounter: Payer: Self-pay | Admitting: Urology

## 2021-11-26 VITALS — BP 106/74 | HR 87 | Wt 187.0 lb

## 2021-11-26 DIAGNOSIS — R1030 Lower abdominal pain, unspecified: Secondary | ICD-10-CM

## 2021-11-26 DIAGNOSIS — R3915 Urgency of urination: Secondary | ICD-10-CM | POA: Diagnosis not present

## 2021-11-26 DIAGNOSIS — R350 Frequency of micturition: Secondary | ICD-10-CM

## 2021-11-26 LAB — BLADDER SCAN AMB NON-IMAGING

## 2021-11-26 NOTE — Progress Notes (Signed)
H&P  Chief Complaint: Abdominal pain  History of Present Illness: Samantha Russell is a 31 y.o. year old female sent for urologic evaluation.  She has had a 19-year history of abdominal pain.  She is followed by gastroenterology.  She has had multiple imaging studies.  Last imaging study from a urologic standpoint is a CT scan from April, 2021.  This revealed normal kidneys, no evidence of urolithiasis, no hydronephrosis.  Bladder appeared roughly normal.  She has multiple complaints regarding her abdomen.  She states that she has been told in the past that she had colitis.  She does not have frequent urinary tract infections.  She has not noted blood in her urine.  She does not currently use tobacco.  She does use marijuana.  She does not drink much water but drinks significant amount of soda.  She does have urinary frequency and urgency and occasional urgency incontinence.  Past Medical History:  Diagnosis Date   Asthma    Bipolar 1 disorder ([place])    Constipation    GERD (gastroesophageal reflux disease)    Multiple gastric ulcers    in [place], [place]   Stomach ulcer     Past Surgical History:  Procedure Laterality Date   BIOPSY  09/16/2021   Procedure: BIOPSY;  Surgeon: Daneil Dolin, MD;  [facility];  Service: Endoscopy;;   COLONOSCOPY WITH PROPOFOL N/A 02/13/2020   normal colon, normal TI, internal and external hemorrhoids   DILATION AND CURETTAGE OF UTERUS     ESOPHAGOGASTRODUODENOSCOPY  2013   [place], [place]   ESOPHAGOGASTRODUODENOSCOPY (EGD) WITH PROPOFOL N/A 02/13/2020   non-obstructing Schatzki ring, not manipulated. Mild erosive reflux esophagitis, small hiatal hernia   ESOPHAGOGASTRODUODENOSCOPY (EGD) WITH PROPOFOL N/A 09/16/2021   normal esophagus s/p dilation, small hiatal hernia, normal duodenum.   MALONEY DILATION  09/16/2021   Procedure: MALONEY DILATION;  Surgeon: Daneil Dolin, MD;  [facility];  Service: Endoscopy;;    Home  Medications:  (Not in a hospital admission)   Allergies:  Allergies  Allergen Reactions   Penicillins Other (See Comments)    Pt is unsure but has been told she is allergic    Family History  Problem Relation Age of Onset   Anxiety disorder Mother    Depression Mother    Heart disease Father    Diabetes Maternal Grandmother    Diabetes Paternal Grandmother    Colon cancer Neg Hx     Social History:  reports that she has been smoking cigarettes. She has a 5.00 pack-year smoking history. She has never used smokeless tobacco. She reports current alcohol use. She reports current drug use. Drug: Marijuana.  ROS: A complete review of systems was performed.  All systems are negative except for pertinent findings as noted.  Physical Exam:  Vital signs in last 24 hours: @VSRANGES @ General:  Alert and oriented, No acute distress HEENT: Normocephalic, atraumatic Neck: No JVD or lymphadenopathy Cardiovascular: Regular rate  Lungs: Normal inspiratory/expiratory excursion Abdomen: Soft, obese.  No specific tenderness.  No masses. Back: No CVA tenderness Pelvic-bladder well supported.  No bladder tenderness.  No pelvic masses noted.  No real pelvic floor tenderness noted. Extremities: No edema Neurologic: Grossly intact  I have reviewed prior pt notes  I have reviewed notes from referring/previous physicians  I have reviewed urinalysis results  I have independently reviewed prior imaging--prior CT scan images were reviewed.  Bladder scan volume 0 Impression/Assessment:  1.  Abdominal pain.  I see  no urologic explanation for this.  She did have prior imaging of her urinary tract with 2 separate CT scans which were both negative for abnormalities (she did have a small right renal cyst)  2.  Overactive bladder symptoms.  She does use significant amount of soda  Plan:  1.  I reassured her that I do not think any significant urologic process is going on other than bladder  overactivity more than likely related to her fluid intake  2.  Overactive bladder guide sheet given  3.  Office visit as needed  Jorja Loa 11/26/2021, 8:06 AM  Lillette Boxer. Ashlynd Michna MD

## 2021-11-27 LAB — MICROSCOPIC EXAMINATION
Bacteria, UA: NONE SEEN
Renal Epithel, UA: NONE SEEN /hpf
WBC, UA: NONE SEEN /hpf (ref 0–5)

## 2021-11-27 LAB — URINALYSIS, ROUTINE W REFLEX MICROSCOPIC
Bilirubin, UA: NEGATIVE
Glucose, UA: NEGATIVE
Leukocytes,UA: NEGATIVE
Nitrite, UA: NEGATIVE
Protein,UA: NEGATIVE
Specific Gravity, UA: 1.025 (ref 1.005–1.030)
Urobilinogen, Ur: 0.2 mg/dL (ref 0.2–1.0)
pH, UA: 6 (ref 5.0–7.5)

## 2021-12-03 ENCOUNTER — Ambulatory Visit: Payer: Medicaid Other | Admitting: Surgery

## 2021-12-03 ENCOUNTER — Encounter: Payer: Self-pay | Admitting: Surgery

## 2021-12-03 ENCOUNTER — Other Ambulatory Visit: Payer: Self-pay

## 2021-12-03 VITALS — BP 105/73 | HR 72 | Temp 98.3°F | Resp 16 | Ht 63.0 in | Wt 186.0 lb

## 2021-12-03 DIAGNOSIS — R1013 Epigastric pain: Secondary | ICD-10-CM

## 2021-12-03 NOTE — Progress Notes (Signed)
[facility] History and Physical  Reason for Referral: Abdominal pain, possible gallbladder Referring Physician: Roseanne Kaufman, NP  Chief Complaint   New Patient (Initial Visit)     Samantha Russell is a 31 y.o. female.  HPI: Patient presents for evaluation for need for cholecystectomy.  She has a long history of abdominal pain and gastrointestinal issues.  She will get epigastric abdominal pain with associated nausea, vomiting, and occasional diarrhea.  She states that warm baths will sometimes help with her pain.  She also describes feelings of hot flashes during bowel movements.  She has a history of multiple EGDs and colonoscopies, and she underwent esophageal dilation for concerns for dysphagia.  She has been previously treated for gastritis.  Associated with her pain, she will get foul-smelling belching.  She denies any significant associations with her pain, as it will just come on randomly, and she almost always has the pain.  Nothing seems to make the pain worse.  She denies postprandial abdominal pain, and denies any right-sided abdominal pain, except for once 2 weeks ago.  She takes medications for bipolar disorder.  She has no history of abdominal surgeries.  She smokes half a pack per day, seldomly drinks alcohol, and uses CBD.  Past Medical History:  Diagnosis Date   Asthma    Bipolar 1 disorder ([place])    Constipation    GERD (gastroesophageal reflux disease)    Multiple gastric ulcers    in [place], [place]   Stomach ulcer     Past Surgical History:  Procedure Laterality Date   BIOPSY  09/16/2021   Procedure: BIOPSY;  Surgeon: Daneil Dolin, MD;  [facility]: [facility];  Service: Endoscopy;;   COLONOSCOPY WITH PROPOFOL N/A 02/13/2020   normal colon, normal TI, internal and external hemorrhoids   DILATION AND CURETTAGE OF UTERUS     ESOPHAGOGASTRODUODENOSCOPY  2013   [place], [place]   ESOPHAGOGASTRODUODENOSCOPY (EGD) WITH PROPOFOL N/A 02/13/2020    non-obstructing Schatzki ring, not manipulated. Mild erosive reflux esophagitis, small hiatal hernia   ESOPHAGOGASTRODUODENOSCOPY (EGD) WITH PROPOFOL N/A 09/16/2021   normal esophagus s/p dilation, small hiatal hernia, normal duodenum.   MALONEY DILATION  09/16/2021   Procedure: MALONEY DILATION;  Surgeon: Daneil Dolin, MD;  Location: [facility];  Service: Endoscopy;;    Family History  Problem Relation Age of Onset   Anxiety disorder Mother    Depression Mother    Heart disease Father    Diabetes Maternal Grandmother    Diabetes Paternal Grandmother    Colon cancer Neg Hx     Social History   Tobacco Use   Smoking status: Every Day    Packs/day: 0.50    Years: 10.00    Pack years: 5.00    Types: Cigarettes   Smokeless tobacco: Never  Vaping Use   Vaping Use: Never used  Substance Use Topics   Alcohol use: Yes    Comment: rare   Drug use: Yes    Types: Marijuana    Medications: I have reviewed the patient's current medications. Allergies as of 12/03/2021       Reactions   Penicillins Other (See Comments)   Pt is unsure but has been told she is allergic        Medication List        Accurate as of December 03, 2021  9:23 AM. If you have any questions, ask your nurse or doctor.          albuterol 108 (  90 Base) MCG/ACT inhaler Commonly known as: VENTOLIN HFA Inhale 2 puffs into the lungs every 6 (six) hours as needed for wheezing or shortness of breath.   diphenhydrAMINE 25 MG tablet Commonly known as: BENADRYL Take 25 mg by mouth every 6 (six) hours as needed for allergies.   fluticasone 110 MCG/ACT inhaler Commonly known as: Flovent HFA Inhale 2 puffs into the lungs 2 (two) times a day.   fluticasone 50 MCG/ACT nasal spray Commonly known as: Flonase Take 1-2 sprays daily What changed:  how much to take how to take this when to take this reasons to take this additional instructions   hyoscyamine 0.125 MG tablet Commonly known as:  LEVSIN Take 1 tablet (0.125 mg total) by mouth every 4 (four) hours as needed.   lamoTRIgine 100 MG tablet Commonly known as: LAMICTAL Take 100 mg by mouth 2 (two) times daily.   omeprazole 40 MG capsule Commonly known as: PRILOSEC Take 1 capsule (40 mg total) by mouth 2 (two) times daily before a meal.   ondansetron 4 MG disintegrating tablet Commonly known as: Zofran ODT Take 1 tablet (4 mg total) by mouth 3 (three) times daily before meals. What changed:  how much to take when to take this   QUEtiapine 100 MG tablet Commonly known as: SEROQUEL Take 100-200 mg by mouth See admin instructions. 100-150 mg twice daily, and 200 mg at bedtime   sertraline 100 MG tablet Commonly known as: ZOLOFT Take 100 mg by mouth in the morning and at bedtime.   sucralfate 1 GM/10ML suspension Commonly known as: CARAFATE TAKE 10 MLS (1 G TOTAL) BY MOUTH 4 (FOUR) TIMES DAILY.         ROS:  Constitutional: negative for chills, fatigue, and fevers Eyes: negative for visual disturbance and pain Ears, nose, mouth, throat, and face: negative for ear drainage, sore throat, and sinus problems Respiratory: negative for cough, wheezing, and shortness of breath Cardiovascular: negative for chest pain and palpitations Gastrointestinal: positive for abdominal pain, nausea, reflux symptoms, and vomiting Genitourinary:positive for frequency, negative for dysuria and urinary retention Integument/breast: negative for dryness and rash Hematologic/lymphatic: negative for bleeding and lymphadenopathy Musculoskeletal:negative for back pain, neck pain, and joint pain Neurological: negative for dizziness, tremors, and numbness Endocrine: positive for temperature intolerance  Weight 186 lb (84.4 kg). Physical Exam Vitals reviewed.  Constitutional:      Appearance: Normal appearance.  HENT:     Head: Normocephalic and atraumatic.  Eyes:     Extraocular Movements: Extraocular movements intact.      Pupils: Pupils are equal, round, and reactive to light.  Cardiovascular:     Rate and Rhythm: Normal rate and regular rhythm.  Pulmonary:     Effort: Pulmonary effort is normal.     Breath sounds: Normal breath sounds.  Abdominal:     Comments: Abdomen soft, nondistended, no percussion tenderness, mild epigastric tenderness to palpation, no rigidity, guarding, rebound tenderness; negative Murphy's  Musculoskeletal:        General: Normal range of motion.     Cervical back: Normal range of motion.  Skin:    General: Skin is warm and dry.  Neurological:     General: No focal deficit present.     Mental Status: She is alert and oriented to person, place, and time.  Psychiatric:        Mood and Affect: Mood normal.        Behavior: Behavior normal.    Results: Abdominal ultrasound (02/02/2020): IMPRESSION: No  definite abnormality seen in the right upper quadrant of the abdomen.  No cholelithiasis  HIDA (11/01/2021): IMPRESSION: Patent biliary tree with normal gallbladder ejection fraction of 90% following fatty meal stimulation.  Assessment & Plan:  Annalina Montezuma is a 31 y.o. female who presents for evaluation of abdominal pain and need for cholecystectomy.  -It was explained to the patient that while she is having epigastric abdominal pain with nausea and vomiting, her symptoms are not consistent with gallbladder pathology as the source of her pain -It was further explained, that she does not demonstrate any abnormal gallbladder pathology on imaging-no gallstones on ultrasound or CT, and HIDA without evidence of biliary dyskinesia -Given her history and imaging is not consistent with gallbladder pathology, recommend against cholecystectomy at this time, as patient will likely still have abdominal complaints after procedure. -We discussed the normal presenting symptoms for symptomatic cholelithiasis, biliary colic, and acute cholecystitis.  Information was provided to the patient  regarding these issues. -Follow up as needed if symptoms change and are more consistent with gallbladder pathology  All questions were answered to the satisfaction of the patient.   Graciella Freer, DO [facility] [address] [place], [place] [address] [phone] (office)

## 2021-12-03 NOTE — Patient Instructions (Signed)
Follow up as needed for re-evaluation of gallbladder.

## 2021-12-05 LAB — 5 HIAA, QUANTITATIVE, URINE, 24 HOUR
5 HIAA, 24 Hour Urine: 2.1 mg/24 h (ref <=6.0)
Total Volume: 600 mL

## 2021-12-09 DIAGNOSIS — R112 Nausea with vomiting, unspecified: Secondary | ICD-10-CM

## 2021-12-09 DIAGNOSIS — R109 Unspecified abdominal pain: Secondary | ICD-10-CM

## 2021-12-13 ENCOUNTER — Other Ambulatory Visit: Payer: Self-pay | Admitting: Gastroenterology

## 2021-12-13 ENCOUNTER — Telehealth: Payer: Self-pay

## 2021-12-13 DIAGNOSIS — R112 Nausea with vomiting, unspecified: Secondary | ICD-10-CM

## 2021-12-13 DIAGNOSIS — R1084 Generalized abdominal pain: Secondary | ICD-10-CM

## 2021-12-13 MED ORDER — ONDANSETRON HCL 8 MG PO TABS
8.0000 mg | ORAL_TABLET | Freq: Three times a day (TID) | ORAL | 0 refills | Status: AC | PRN
Start: 2021-12-13 — End: ?

## 2021-12-13 MED ORDER — HYOSCYAMINE SULFATE 0.125 MG PO TABS: 0.1250 mg | ORAL_TABLET | ORAL | 0 refills | Status: DC | PRN

## 2021-12-13 NOTE — Telephone Encounter (Signed)
Will defer work note to [employer].  ? ?See patient message note for details on additional recommendations for her current symptoms. ?

## 2021-12-13 NOTE — Telephone Encounter (Signed)
Spoke with patient.  Started having a flare of her chronic intermittent symptoms on Thursday with some abdominal pain, nausea, and regurgitation.  She is already starting to feel somewhat better today.  She is taking PPI twice a day as prescribed.  Ran out of Zofran and states she and Tobi Bastos talked about taking a higher dose due to having to take several 4 mg Zofran when her symptoms start.  Never received prescription of Levsin.  States it never showed up her pharmacy.   ? ?Recommended continuing omeprazole 40 mg twice daily, Zofran 8 mg every 8 hours as needed.  New prescription for Zofran was sent to her pharmacy.  She also requested that I send in a new prescription of Levsin as she never got to try this.  It was previously recommended by Tobi Bastos on 1/18.  New prescription for Levsin was sent to her pharmacy. ? ?Recommended liquid diet for the next 24 hours, then advancing as tolerated.  She does not have gastroparesis per recent gastric emptying study, but I did recommend that she avoid fried, fatty, greasy foods and also avoid high fibrous foods such as raw vegetables and fruits for now. ? ?Further recommendations per Tobi Bastos when she returns. ?

## 2021-12-13 NOTE — Telephone Encounter (Signed)
Samantha Russell, ?I am just sending to you as a FYI because it is basically regarding a work note and because I advised the pt to go to ER if she gets worsening symptoms. She has a few Mychart notes back and forth between her and Tobi Bastos. ?

## 2021-12-17 NOTE — Telephone Encounter (Signed)
Referral sent 

## 2021-12-17 NOTE — Telephone Encounter (Signed)
Please refer patient to tertiary care (Wherever insurance is accepted) for second opinion. Chronic abdominal pain, N/V. Thus far evaluation unrevealing.  ?

## 2021-12-17 NOTE — Addendum Note (Signed)
Addended by: Cheron Every on: 12/17/2021 09:51 AM ? ? Modules accepted: Orders ? ?

## 2021-12-19 ENCOUNTER — Telehealth: Payer: Self-pay | Admitting: *Deleted

## 2021-12-19 NOTE — Telephone Encounter (Signed)
Difficult to do work note as I do not know how often will be having symptoms. She needs to completely avoid all forms of marijuana.  ?

## 2021-12-19 NOTE — Telephone Encounter (Signed)
Lmom for pt to return my call.  

## 2021-12-19 NOTE — Telephone Encounter (Signed)
Samantha Mink, NP ? 9:37 AM ?Note ?Please refer patient to tertiary care (Wherever insurance is accepted) for second opinion. Chronic abdominal pain, N/V. Thus far evaluation unrevealing  ?  ?---- ? ? ?Referral was sent to [facility] and received letter back stating unable to to provide services in timely matter and recommends patient to be seen by their local provider. New referral sent to [facility]. FYI to Tobi Bastos ?

## 2021-12-23 NOTE — Telephone Encounter (Signed)
FYI: ?Samantha Russell I phoned the pt and advised of note and recommendations to avoid all forms of marijuana and the pt expressed understanding and wanted to know regarding her referral out and I advised her of the referral made to [facility]. ?

## 2022-01-05 ENCOUNTER — Other Ambulatory Visit: Payer: Self-pay | Admitting: Gastroenterology

## 2022-01-05 DIAGNOSIS — R1084 Generalized abdominal pain: Secondary | ICD-10-CM

## 2022-01-06 NOTE — Telephone Encounter (Signed)
Last ov 10/30/21 ?

## 2022-01-29 NOTE — Telephone Encounter (Signed)
Spoke with [facility] scheduling and was advised when they reached out to pt she cancelled the referral and didn't want to schedule appt. FYI to Tobi Bastos ?

## 2022-04-16 ENCOUNTER — Telehealth: Payer: Self-pay

## 2022-04-16 NOTE — Telephone Encounter (Signed)
Documentation from pt's insurance stating that on 05/27/2022 that the pt's Hyoscyamine Sulf 0.125 mg tab will no longer be covered due to it is not FDA approved. Documentation on your desk in your yellow folder.

## 2022-04-21 NOTE — Telephone Encounter (Signed)
Noted  

## 2022-05-10 IMAGING — NM NM GASTRIC EMPTYING
10 series · 10 of 10 positions shown · non-contrast
Comparison: None.

CLINICAL DATA: Suspected gastroparesis.

EXAM:
NUCLEAR MEDICINE GASTRIC EMPTYING SCAN
TECHNIQUE: After oral ingestion of radiolabeled meal, sequential abdominal
images were obtained for 4 hours. Percentage of activity emptying
the stomach was calculated at 1 hour, 2 hour, 3 hour, and 4 hours.
RADIOPHARMACEUTICALS:  2.2 mCi 9c-CCm sulfur colloid in standardized
egg substitute meal with toast

[Series 1: 0 min · 4.14mm/px · 1 of 1 slices shown (1 of 2)]
[im 1/1]
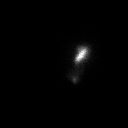

[Series 1: 0 min · 4.14mm/px · 1 of 1 slices shown (2 of 2)]
[im 1/1]
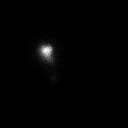

[Series 2: 60 min · 4.14mm/px · 1 of 1 slices shown (1 of 2)]
[im 1/1  full-range]
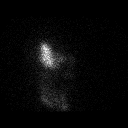

[Series 2: 60 min · 4.14mm/px · 1 of 1 slices shown (2 of 2)]
[im 1/1]
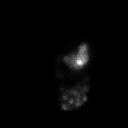

[Series 3: 120 min · 4.14mm/px · 1 of 1 slices shown (1 of 2)]
[im 1/1]
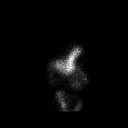

[Series 3: 120 min · 4.14mm/px · 1 of 1 slices shown (2 of 2)]
[im 1/1  full-range]
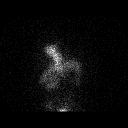

[Series 4: 180 min · 4.14mm/px · 1 of 1 slices shown (1 of 2)]
[im 1/1]
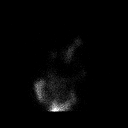

[Series 4: 180 min · 4.14mm/px · 1 of 1 slices shown (2 of 2)]
[im 1/1]
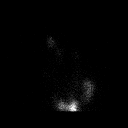

[Series 5: 240 min · 4.14mm/px · 1 of 1 slices shown (1 of 2)]
[im 1/1]
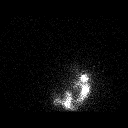

[Series 5: 240 min · 4.14mm/px · 1 of 1 slices shown (2 of 2)]
[im 1/1]
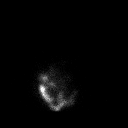

[10 of 10 positions shown; findings below may reference images not displayed]

FINDINGS: Expected location of the stomach in the left upper quadrant.
Ingested meal empties the stomach gradually over the course of the
study.

37.35% emptied at 1 hr ( normal >= 10%)

54.85% emptied at 2 hr ( normal >= 40%)

87.88% emptied at 3 hr ( normal >= 70%)

97.51% emptied at 4 hr ( normal >= 90%)
IMPRESSION: Within normal limits gastric emptying study.

## 2022-05-31 ENCOUNTER — Other Ambulatory Visit: Payer: Self-pay | Admitting: Gastroenterology

## 2022-05-31 DIAGNOSIS — R1084 Generalized abdominal pain: Secondary | ICD-10-CM

## 2022-07-18 ENCOUNTER — Other Ambulatory Visit: Payer: Self-pay | Admitting: Gastroenterology
